# Patient Record
Sex: Female | Born: 1970 | Race: White | Hispanic: No | Smoking: Former smoker
Health system: Southern US, Community
[De-identification: ages and names within clinical notes are randomized; demographics above are authoritative.]

## PROBLEM LIST (undated history)

## (undated) DIAGNOSIS — R319 Hematuria, unspecified: Secondary | ICD-10-CM

## (undated) DIAGNOSIS — E559 Vitamin D deficiency, unspecified: Secondary | ICD-10-CM

## (undated) DIAGNOSIS — Z9884 Bariatric surgery status: Secondary | ICD-10-CM

## (undated) DIAGNOSIS — I10 Essential (primary) hypertension: Secondary | ICD-10-CM

## (undated) DIAGNOSIS — Z8639 Personal history of other endocrine, nutritional and metabolic disease: Secondary | ICD-10-CM

## (undated) DIAGNOSIS — N200 Calculus of kidney: Secondary | ICD-10-CM

## (undated) DIAGNOSIS — J302 Other seasonal allergic rhinitis: Secondary | ICD-10-CM

## (undated) DIAGNOSIS — R7303 Prediabetes: Secondary | ICD-10-CM

## (undated) DIAGNOSIS — R350 Frequency of micturition: Secondary | ICD-10-CM

## (undated) DIAGNOSIS — R3915 Urgency of urination: Secondary | ICD-10-CM

## (undated) DIAGNOSIS — F419 Anxiety disorder, unspecified: Secondary | ICD-10-CM

## (undated) DIAGNOSIS — R609 Edema, unspecified: Secondary | ICD-10-CM

## (undated) DIAGNOSIS — E039 Hypothyroidism, unspecified: Secondary | ICD-10-CM

## (undated) DIAGNOSIS — G4730 Sleep apnea, unspecified: Secondary | ICD-10-CM

## (undated) DIAGNOSIS — Z8669 Personal history of other diseases of the nervous system and sense organs: Secondary | ICD-10-CM

## (undated) DIAGNOSIS — J189 Pneumonia, unspecified organism: Secondary | ICD-10-CM

## (undated) DIAGNOSIS — D509 Iron deficiency anemia, unspecified: Secondary | ICD-10-CM

## (undated) DIAGNOSIS — F32A Depression, unspecified: Secondary | ICD-10-CM

## (undated) DIAGNOSIS — I639 Cerebral infarction, unspecified: Secondary | ICD-10-CM

## (undated) DIAGNOSIS — E079 Disorder of thyroid, unspecified: Secondary | ICD-10-CM

## (undated) DIAGNOSIS — Z87442 Personal history of urinary calculi: Secondary | ICD-10-CM

## (undated) DIAGNOSIS — K219 Gastro-esophageal reflux disease without esophagitis: Secondary | ICD-10-CM

## (undated) DIAGNOSIS — J329 Chronic sinusitis, unspecified: Secondary | ICD-10-CM

## (undated) HISTORY — PX: MULTIPLE TOOTH EXTRACTIONS: SHX2053

## (undated) HISTORY — PX: PERCUTANEOUS NEPHROSTOLITHOTOMY: SHX2207

---

## 1997-06-22 ENCOUNTER — Ambulatory Visit (HOSPITAL_COMMUNITY): Admission: RE | Admit: 1997-06-22 | Discharge: 1997-06-22 | Payer: Self-pay | Admitting: Endocrinology

## 1997-09-27 ENCOUNTER — Other Ambulatory Visit: Admission: RE | Admit: 1997-09-27 | Discharge: 1997-09-27 | Payer: Self-pay | Admitting: Obstetrics and Gynecology

## 1997-11-20 ENCOUNTER — Other Ambulatory Visit: Admission: RE | Admit: 1997-11-20 | Discharge: 1997-11-20 | Payer: Self-pay | Admitting: Obstetrics and Gynecology

## 1999-02-21 ENCOUNTER — Other Ambulatory Visit: Admission: RE | Admit: 1999-02-21 | Discharge: 1999-02-21 | Payer: Self-pay | Admitting: *Deleted

## 2000-04-16 ENCOUNTER — Other Ambulatory Visit: Admission: RE | Admit: 2000-04-16 | Discharge: 2000-04-16 | Payer: Self-pay | Admitting: *Deleted

## 2001-06-21 ENCOUNTER — Other Ambulatory Visit: Admission: RE | Admit: 2001-06-21 | Discharge: 2001-06-21 | Payer: Self-pay | Admitting: Obstetrics & Gynecology

## 2002-08-01 ENCOUNTER — Encounter: Payer: Self-pay | Admitting: Urology

## 2002-08-01 ENCOUNTER — Encounter: Admission: RE | Admit: 2002-08-01 | Discharge: 2002-08-01 | Payer: Self-pay | Admitting: Urology

## 2002-08-04 ENCOUNTER — Encounter: Payer: Self-pay | Admitting: Urology

## 2002-08-04 ENCOUNTER — Ambulatory Visit (HOSPITAL_BASED_OUTPATIENT_CLINIC_OR_DEPARTMENT_OTHER): Admission: RE | Admit: 2002-08-04 | Discharge: 2002-08-04 | Payer: Self-pay | Admitting: Urology

## 2003-05-01 ENCOUNTER — Emergency Department (HOSPITAL_COMMUNITY): Admission: EM | Admit: 2003-05-01 | Discharge: 2003-05-01 | Payer: Self-pay | Admitting: Emergency Medicine

## 2007-11-09 ENCOUNTER — Other Ambulatory Visit: Admission: RE | Admit: 2007-11-09 | Discharge: 2007-11-09 | Payer: Self-pay | Admitting: Obstetrics and Gynecology

## 2007-12-27 ENCOUNTER — Encounter: Admission: RE | Admit: 2007-12-27 | Discharge: 2007-12-27 | Payer: Self-pay | Admitting: Internal Medicine

## 2008-03-20 HISTORY — PX: LAPAROSCOPIC GASTRIC BYPASS: SUR771

## 2010-03-24 HISTORY — PX: OTHER SURGICAL HISTORY: SHX169

## 2011-09-23 ENCOUNTER — Other Ambulatory Visit: Payer: Self-pay | Admitting: Urology

## 2011-10-14 ENCOUNTER — Encounter (HOSPITAL_BASED_OUTPATIENT_CLINIC_OR_DEPARTMENT_OTHER): Payer: Self-pay | Admitting: *Deleted

## 2011-10-14 NOTE — Progress Notes (Signed)
NPO AFTER MN. ARRIVES AT 0715. NEEDS HG, URINE PREG., AND KUB.

## 2011-10-16 NOTE — H&P (Signed)
History of Present Illness        Left nephrolithiasis: A CT scan done on  08/28/11 revealed 5 stones in the left kidney located in the lower pole and midportion of the kidney with Hounsfield units ranging from 600-800. Her stones are visible but somewhat difficult to see on KUB. She underwent ESL by Dr. Aldean Ast in 5/04 for left renal calculi. In addition she underwent a percutaneous nephrolithotomy on the left side in [place] in 2/12 for a 2.5 cm left renal calculus.  Stone composition 2/12: CaOx.  Interval history: There was a question of whether her stones could be struvite however I note her most recent urine culture was negative and the one prior to that grew coag negative staph. I do not see where she has ever had metabolic workup for her stones. She she continues to have some intermittent left flank pain.   Past Medical History Problems  1. History of  Anxiety (Symptom) 300.00 2. History of  Depression 311 3. History of  Diabetes Mellitus 250.00 4. Former Smoker V15.82 5. History of  Gastric Ulcer 531.90 6. History of  Heartburn 787.1 7. History of  Hypercholesterolemia 272.0 8. History of  Hypertension 401.9 9. History of  Hypothyroidism 244.9 10. History of  Sleep Apnea 780.57  Surgical History Problems  1. History of  Cesarean Section 2. History of  Cystoscopy With Ureteroscopy 3. History of  High Gastric Bypass 4. History of  Percutaneous Lithotomy  Current Meds 1. Biotin 1000 MCG Oral Tablet; Therapy: (Recorded:06Jun2013) to 2. Fexofenadine HCl TABS; Therapy: (Recorded:06Jun2013) to 3. Fluconazole 100 MG Oral Tablet; TAKE 1 TABLET DAILY AS DIRECTED; Therapy: 06Jun2013 to  (Evaluate:07Jun2013); Last Rx:06Jun2013 4. Iron TABS; Therapy: (Recorded:17Jun2013) to 5. PriLOSEC 20 MG Oral Capsule Delayed Release; Therapy: (Recorded:06Jun2013) to 6. Vitamin D3 CAPS; Therapy: (Recorded:06Jun2013) to  Allergies Medication  1. No Known Drug Allergies  Family  History Problems  1. Maternal history of  Nephrolithiasis 2. Maternal history of  Urinary Frequency 3. Maternal history of  Urinary Tract Infection  Social History Problems  1. Caffeine Use 2. Marital History - Divorced V61.03 3. 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 History of  Alcohol Use  Review of Systems Constitutional, skin, eye, otolaryngeal, hematologic/lymphatic, cardiovascular, pulmonary, endocrine, musculoskeletal, gastrointestinal, neurological and psychiatric system(s) were reviewed and pertinent findings if present are noted.  Genitourinary: urinary frequency.    Vitals Vital Signs BMI Calculated: 36.37 BSA Calculated: 1.75 Height: 4 ft 10.5 in Weight: 178 lb  Blood Pressure: 125 / 84 Temperature: 98.7 F Heart Rate: 89  Physical Exam Constitutional: Well nourished and well developed . No acute distress.  ENT:. The ears and nose are normal in appearance.  Neck: The appearance of the neck is normal and no neck mass is present.  Pulmonary: No respiratory distress and normal respiratory rhythm and effort.  Cardiovascular: Heart rate and rhythm are normal . No peripheral edema.  Abdomen: The abdomen is soft and nontender. No masses are palpated. No CVA tenderness. No hernias are palpable. No hepatosplenomegaly noted.  Lymphatics: The femoral and inguinal nodes are not enlarged or tender.  Skin: Normal skin turgor, no visible rash and no visible skin lesions.  Neuro/Psych:. Mood and affect are appropriate.   . Genitourinary:  On physical examination she was not toxic.  She had little to no CVA tenderness or lateralization.   Assessment Assessed  1. Nephrolithiasis Of The Left Kidney 592.0   We discussed the options for treatment of her left renal calculi.  I do not believe percutaneous procedure is necessary. We discussed lithotripsy versus ureteroscopy and I've gone over the pluses and minuses of each of these. She's had previous ureteroscopy and previous  lithotripsy. In my opinion ureteroscopy with laser lithotripsy is most likely going to be most successful at removing the most stone burden. I've gone over the procedure with her in detail including its risks and complications and the need for a stent postoperatively. In addition I'm going to begin evaluation for stone risk.   Plan    1. Hypercalciuria profile today. 2. 24-hour urine for stone risk. 3. She is scheduled for left ureteroscopy and laser lithotripsy of her left renal calculi.

## 2011-10-17 ENCOUNTER — Encounter (HOSPITAL_BASED_OUTPATIENT_CLINIC_OR_DEPARTMENT_OTHER)
Admission: RE | Disposition: A | Discharge status: Home / Self Care | Payer: Self-pay | Source: Ambulatory Visit | Attending: Urology

## 2011-10-17 ENCOUNTER — Encounter (HOSPITAL_BASED_OUTPATIENT_CLINIC_OR_DEPARTMENT_OTHER): Payer: Self-pay | Admitting: *Deleted

## 2011-10-17 ENCOUNTER — Encounter (HOSPITAL_BASED_OUTPATIENT_CLINIC_OR_DEPARTMENT_OTHER): Payer: Self-pay | Admitting: Anesthesiology

## 2011-10-17 ENCOUNTER — Ambulatory Visit (HOSPITAL_COMMUNITY): Payer: 59

## 2011-10-17 ENCOUNTER — Ambulatory Visit (HOSPITAL_BASED_OUTPATIENT_CLINIC_OR_DEPARTMENT_OTHER)
Admission: RE | Admit: 2011-10-17 | Discharge: 2011-10-17 | Disposition: A | Discharge status: Home / Self Care | Payer: 59 | Source: Ambulatory Visit | Attending: Urology | Admitting: Urology

## 2011-10-17 ENCOUNTER — Ambulatory Visit (HOSPITAL_BASED_OUTPATIENT_CLINIC_OR_DEPARTMENT_OTHER): Payer: 59 | Admitting: Anesthesiology

## 2011-10-17 DIAGNOSIS — E039 Hypothyroidism, unspecified: Secondary | ICD-10-CM | POA: Insufficient documentation

## 2011-10-17 DIAGNOSIS — Z79899 Other long term (current) drug therapy: Secondary | ICD-10-CM | POA: Insufficient documentation

## 2011-10-17 DIAGNOSIS — N200 Calculus of kidney: Secondary | ICD-10-CM | POA: Diagnosis present

## 2011-10-17 DIAGNOSIS — E119 Type 2 diabetes mellitus without complications: Secondary | ICD-10-CM | POA: Insufficient documentation

## 2011-10-17 DIAGNOSIS — I10 Essential (primary) hypertension: Secondary | ICD-10-CM | POA: Insufficient documentation

## 2011-10-17 DIAGNOSIS — G4730 Sleep apnea, unspecified: Secondary | ICD-10-CM | POA: Insufficient documentation

## 2011-10-17 DIAGNOSIS — E7800 Pure hypercholesterolemia, unspecified: Secondary | ICD-10-CM | POA: Insufficient documentation

## 2011-10-17 HISTORY — DX: Personal history of other diseases of the nervous system and sense organs: Z86.69

## 2011-10-17 HISTORY — DX: Other seasonal allergic rhinitis: J30.2

## 2011-10-17 HISTORY — DX: Hematuria, unspecified: R31.9

## 2011-10-17 HISTORY — DX: Vitamin D deficiency, unspecified: E55.9

## 2011-10-17 HISTORY — DX: Frequency of micturition: R35.0

## 2011-10-17 HISTORY — DX: Urgency of urination: R39.15

## 2011-10-17 HISTORY — DX: Iron deficiency anemia, unspecified: D50.9

## 2011-10-17 HISTORY — DX: Bariatric surgery status: Z98.84

## 2011-10-17 HISTORY — DX: Calculus of kidney: N20.0

## 2011-10-17 HISTORY — DX: Personal history of other endocrine, nutritional and metabolic disease: Z86.39

## 2011-10-17 HISTORY — DX: Personal history of urinary calculi: Z87.442

## 2011-10-17 HISTORY — PX: CYSTOSCOPY W/ URETERAL STENT PLACEMENT: SHX1429

## 2011-10-17 LAB — GLUCOSE, CAPILLARY: Glucose-Capillary: 103 mg/dL — ABNORMAL HIGH (ref 70–99)

## 2011-10-17 LAB — POCT HEMOGLOBIN-HEMACUE: Hemoglobin: 13.8 g/dL (ref 12.0–15.0)

## 2011-10-17 LAB — POCT PREGNANCY, URINE: Preg Test, Ur: NEGATIVE

## 2011-10-17 SURGERY — HOLMIUM LASER APPLICATION
Anesthesia: General | Site: Ureter | Laterality: Left

## 2011-10-17 MED ORDER — LIDOCAINE HCL (CARDIAC) 20 MG/ML IV SOLN
INTRAVENOUS | Status: DC | PRN
  Administered 2011-10-17: 80 mg via INTRAVENOUS

## 2011-10-17 MED ORDER — BELLADONNA ALKALOIDS-OPIUM 16.2-60 MG RE SUPP
RECTAL | Status: DC | PRN
  Administered 2011-10-17: 1 via RECTAL

## 2011-10-17 MED ORDER — PHENAZOPYRIDINE HCL 200 MG PO TABS: 200.0000 mg | ORAL_TABLET | Freq: Three times a day (TID) | ORAL | Status: AC | PRN

## 2011-10-17 MED ORDER — EPHEDRINE SULFATE 50 MG/ML IJ SOLN
INTRAMUSCULAR | Status: DC | PRN
  Administered 2011-10-17: 10 mg via INTRAVENOUS

## 2011-10-17 MED ORDER — HYDROCODONE-ACETAMINOPHEN 10-325 MG PO TABS: 1.0000 | ORAL_TABLET | ORAL | Status: AC | PRN

## 2011-10-17 MED ORDER — FENTANYL CITRATE 0.05 MG/ML IJ SOLN
INTRAMUSCULAR | Status: DC | PRN
  Administered 2011-10-17: 25 ug via INTRAVENOUS
  Administered 2011-10-17: 50 ug via INTRAVENOUS
  Administered 2011-10-17: 25 ug via INTRAVENOUS
  Administered 2011-10-17: 50 ug via INTRAVENOUS
  Administered 2011-10-17 (×3): 25 ug via INTRAVENOUS

## 2011-10-17 MED ORDER — IOHEXOL 350 MG/ML SOLN
INTRAVENOUS | Status: DC | PRN
  Administered 2011-10-17: 20 mL

## 2011-10-17 MED ORDER — DEXAMETHASONE SODIUM PHOSPHATE 4 MG/ML IJ SOLN
INTRAMUSCULAR | Status: DC | PRN
  Administered 2011-10-17: 10 mg via INTRAVENOUS

## 2011-10-17 MED ORDER — SODIUM CHLORIDE 0.9 % IR SOLN
Status: DC | PRN
  Administered 2011-10-17: 3000 mL

## 2011-10-17 MED ORDER — CIPROFLOXACIN IN D5W 200 MG/100ML IV SOLN
200.0000 mg | INTRAVENOUS | Status: AC
  Administered 2011-10-17: 200 mg via INTRAVENOUS

## 2011-10-17 MED ORDER — LACTATED RINGERS IV SOLN
INTRAVENOUS | Status: DC
  Administered 2011-10-17: 08:00:00 via INTRAVENOUS
  Administered 2011-10-17: 100 mL/h via INTRAVENOUS
  Administered 2011-10-17: 10:00:00 via INTRAVENOUS

## 2011-10-17 MED ORDER — LIDOCAINE HCL 2 % EX GEL
CUTANEOUS | Status: DC | PRN
  Administered 2011-10-17: 1

## 2011-10-17 MED ORDER — ONDANSETRON HCL 4 MG/2ML IJ SOLN
INTRAMUSCULAR | Status: DC | PRN
  Administered 2011-10-17: 4 mg via INTRAVENOUS

## 2011-10-17 MED ORDER — FENTANYL CITRATE 0.05 MG/ML IJ SOLN: 25.0000 ug | INTRAMUSCULAR | Status: DC | PRN

## 2011-10-17 MED ORDER — MIDAZOLAM HCL 5 MG/5ML IJ SOLN
INTRAMUSCULAR | Status: DC | PRN
  Administered 2011-10-17 (×2): 0.5 mg via INTRAVENOUS
  Administered 2011-10-17: 1 mg via INTRAVENOUS

## 2011-10-17 MED ORDER — PROPOFOL 10 MG/ML IV EMUL
INTRAVENOUS | Status: DC | PRN
  Administered 2011-10-17: 200 mg via INTRAVENOUS
  Administered 2011-10-17: 100 mg via INTRAVENOUS

## 2011-10-17 SURGICAL SUPPLY — 45 items
ADAPTER CATH URET PLST 4-6FR (CATHETERS) ×2 IMPLANT
ADPR CATH URET STRL DISP 4-6FR (CATHETERS) ×2
BAG DRAIN URO-CYSTO SKYTR STRL (DRAIN) ×3 IMPLANT
BAG DRN UROCATH (DRAIN) ×2
BASKET LASER NITINOL 1.9FR (BASKET) IMPLANT
BASKET STNLS GEMINI 4WIRE 3FR (BASKET) IMPLANT
BASKET ZERO TIP NITINOL 2.4FR (BASKET) ×4 IMPLANT
BRUSH URET BIOPSY 3F (UROLOGICAL SUPPLIES) IMPLANT
BSKT STON RTRVL 120 1.9FR (BASKET)
BSKT STON RTRVL GEM 120X11 3FR (BASKET)
BSKT STON RTRVL ZERO TP 2.4FR (BASKET) ×4
CANISTER SUCT LVC 12 LTR MEDI- (MISCELLANEOUS) ×2 IMPLANT
CATH INTERMIT  6FR 70CM (CATHETERS) IMPLANT
CATH URET 5FR 28IN CONE TIP (BALLOONS)
CATH URET 5FR 70CM CONE TIP (BALLOONS) IMPLANT
CLOTH BEACON ORANGE TIMEOUT ST (SAFETY) ×3 IMPLANT
DRAPE CAMERA CLOSED 9X96 (DRAPES) ×3 IMPLANT
ELECT REM PT RETURN 9FT ADLT (ELECTROSURGICAL)
ELECTRODE REM PT RTRN 9FT ADLT (ELECTROSURGICAL) IMPLANT
FIBER LASER TRAC TIP (UROLOGICAL SUPPLIES) ×2 IMPLANT
GLOVE BIO SURGEON STRL SZ8 (GLOVE) ×3 IMPLANT
GLOVE INDICATOR 6.5 STRL GRN (GLOVE) ×4 IMPLANT
GOWN PREVENTION PLUS LG XLONG (DISPOSABLE) ×3 IMPLANT
GOWN STRL REIN XL XLG (GOWN DISPOSABLE) ×3 IMPLANT
GOWN SURGICAL LARGE (GOWNS) ×2 IMPLANT
GUIDEWIRE 0.038 PTFE COATED (WIRE) IMPLANT
GUIDEWIRE ANG ZIPWIRE 038X150 (WIRE) IMPLANT
GUIDEWIRE STR DUAL SENSOR (WIRE) ×3 IMPLANT
IV NS IRRIG 3000ML ARTHROMATIC (IV SOLUTION) ×6 IMPLANT
KIT BALLIN UROMAX 15FX10 (LABEL) IMPLANT
KIT BALLN UROMAX 15FX4 (MISCELLANEOUS) IMPLANT
KIT BALLN UROMAX 26 75X4 (MISCELLANEOUS)
LASER FIBER DISP (UROLOGICAL SUPPLIES) IMPLANT
NS IRRIG 1000ML POUR BTL (IV SOLUTION) ×2 IMPLANT
PACK CYSTOSCOPY (CUSTOM PROCEDURE TRAY) ×3 IMPLANT
SET HIGH PRES BAL DIL (LABEL)
SHEATH ACCESS URETERAL 24CM (SHEATH) ×2 IMPLANT
SHEATH ACCESS URETERAL 38CM (SHEATH) IMPLANT
SHEATH ACCESS URETERAL 54CM (SHEATH) IMPLANT
SHEATH URET ACCESS 12FR/35CM (UROLOGICAL SUPPLIES) IMPLANT
SHEATH URET ACCESS 12FR/55CM (UROLOGICAL SUPPLIES) IMPLANT
STENT CONTOUR 6FRX24X.038 (STENTS) IMPLANT
STENT URET 6FRX24 CONTOUR (STENTS) ×2 IMPLANT
SYR CONTROL 10ML LL (SYRINGE) ×2 IMPLANT
WATER STERILE IRR 3000ML UROMA (IV SOLUTION) IMPLANT

## 2011-10-17 NOTE — Anesthesia Postprocedure Evaluation (Signed)
  Anesthesia Post-op Note  Patient: Tonya Cameron  Procedure(s) Performed: Procedure(s) (LRB): HOLMIUM LASER APPLICATION (Left) CYSTOSCOPY WITH RETROGRADE PYELOGRAM/URETERAL STENT PLACEMENT (Left)  Patient Location: PACU  Anesthesia Type: General  Level of Consciousness: oriented and sedated  Airway and Oxygen Therapy: Patient Spontanous Breathing  Post-op Pain: mild  Post-op Assessment: Post-op Vital signs reviewed, Patient's Cardiovascular Status Stable, Respiratory Function Stable and Patent Airway  Post-op Vital Signs: stable  Complications: No apparent anesthesia complications

## 2011-10-17 NOTE — Anesthesia Procedure Notes (Signed)
Procedure Name: LMA Insertion Date/Time: 10/17/2011 9:16 AM Performed by: Fran Lowes Pre-anesthesia Checklist: Patient identified, Emergency Drugs available, Suction available and Patient being monitored Patient Re-evaluated:Patient Re-evaluated prior to inductionOxygen Delivery Method: Circle System Utilized Preoxygenation: Pre-oxygenation with 100% oxygen Intubation Type: IV induction Ventilation: Mask ventilation without difficulty LMA: LMA inserted LMA Size: 4.0 Number of attempts: 1 Airway Equipment and Method: bite block Placement Confirmation: positive ETCO2 Tube secured with: Tape Dental Injury: Teeth and Oropharynx as per pre-operative assessment  Comments: LMA inserted by Dr. Shireen Quan.

## 2011-10-17 NOTE — Op Note (Signed)
PATIENT:  Tonya Cameron  PRE-OPERATIVE DIAGNOSIS: Left renal calculi and  POST-OPERATIVE DIAGNOSIS: Same  PROCEDURE:  1. Cystoscopy with left retrograde pyelogram including interpretation 2. Left ureteroscopy with laser lithotripsy and stone extraction 3. Left double-J stent placement  SURGEON: Garnett Farm, MD  INDICATION: Patient is a 41 year old female with a history of renal calculi. She had a CT scan in 6/13 which revealed 5 stones in the left kidney located in the lower and midportion of the kidney with Hounsfield units ranging from 600-800. Her stones were visible but difficult to see on KUB. She had undergone a previous lithotripsy as well as a percutaneous nephrolithotomy in the past. She continued to have intermittent left flank pain and I felt although she had numerous stones in her kidney that a percutaneous procedure would be more procedure then necessary and therefore recommended ureteroscopy. We discussed the procedure its risks and complications as well as alternatives. She understands and has elected to proceed.  ANESTHESIA:  General  EBL:  Minimal  DRAINS: 6 French, 24 cm stent in the left ureter (with string)  SPECIMEN:  Stones  DISPOSITION OF SPECIMEN:  Given to patient  DESCRIPTION OF PROCEDURE: The patient was taken to the major OR and placed on the table. General anesthesia was administered and then the patient was moved to the dorsal lithotomy position. The genitalia was sterilely prepped and draped. An official timeout was performed.  Initially the 22 French cystoscope with 12 lens was passed under direct vision through the urethra which was noted be normal. The bladder was then entered and fully inspected. It was noted be free of any tumors stones or inflammatory lesions. Ureteral orifices were of normal configuration and position. A 6 French open-ended ureteral catheter was then passed through the cystoscope into the ureteral orifice in order to perform  a left retrograde pyelogram.  A retrograde pyelogram was performed by injecting full-strength contrast up the left ureter under direct fluoroscopic control. It revealed what appeared to be filling defects in the renal pelvis and middle pole calyx. There appeared to be some blunting of the calyces. I also noted that it was difficult to fill the lower pole calyces as it appeared there was some mild infundibular stenosis. This is likely secondary to her previous percutaneous nephrolithotomy. I then passed a 0.038 inch floppy-tipped guidewire through the open ended catheter and into the area of the renal pelvis and this was left in place. I passed an access sheath over the guidewire and up the left ureter without difficulty.. I then proceeded with ureteroscopy.  A 6 French flexible, digital ureteroscope was then passed through the access sheath and into the area of the renal pelvis. I first identified a stone in the renal pelvis. There was also one in the upper pole. These were fairly large and were treated with laser lithotripsy using a 200  holmium laser fiber. I was able to fragment both of the stones and then used the Nitinol basket to grasp and extract the stone fragments. 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 I was however able to pass the scope through this area and identified a stone. It was grasped with a Nitinol basket and drawn down the ureter but was too large to extract and therefore was left in the basket. I removed the outer sheath of the basket and the ureteroscope and then passed the ureteroscope back through the access sheath and used the laser to fragment the  stone while it was held in the basket. I then was able to remove the basket and inserted a new basket and remove the fragments. Finally I was able to flex the scope and access the lower pole with a great deal of difficulty. I was able to identify a stone that was fairly small measuring about  2-3 mm but despite multiple attempts I was unable to manipulate the scope and basket in a fashion that would allow me to engage the stone. I did find a stone in a parallel lower pole calyx and was able to grasp that with the Nitinol basket. It was drawn into the ureter and released from the basket. I then used the holmium laser to fragment the stone and extract the fragments. Finally the ureteroscope was again passed into the area the renal pelvis and each of the calyces was inspected and no further stones could be located in the upper or middle pole or the renal pelvis. There did appear to be a single stone and possibly a second smaller stone lower pole but could not be extracted. I therefore passed the guidewire through the ureteroscope into the area the renal pelvis and removed the ureteroscope.  I then backloaded the cystoscope over the guidewire and passed the stent over the guidewire into the area of the renal pelvis. As the guidewire was removed good curl was noted in the renal pelvis. The bladder was drained and the cystoscope was then removed. The patient tolerated the procedure well no intraoperative complications.  She will return to the office for stent removal. At that time one of her stones can be sent for composition analysis although clinically they were quite hard and clearly not struvite. She will then need to undergo a metabolic stone evaluation if one had not been previously done by her previous urologist. Those notes were requested.   PLAN OF CARE: Discharge to home after PACU  PATIENT DISPOSITION:  PACU - hemodynamically stable.

## 2011-10-17 NOTE — Transfer of Care (Signed)
Immediate Anesthesia Transfer of Care Note  Patient: Tonya Cameron  Procedure(s) Performed: Procedure(s) (LRB): HOLMIUM LASER APPLICATION (Left) CYSTOSCOPY WITH RETROGRADE PYELOGRAM/URETERAL STENT PLACEMENT (Left)  Patient Location: PACU  Anesthesia Type: General  Level of Consciousness: awake, oriented, sedated and patient cooperative  Airway & Oxygen Therapy: Patient Spontanous Breathing and Patient connected to face mask oxygen  Post-op Assessment: Report given to PACU RN and Post -op Vital signs reviewed and stable  Post vital signs: Reviewed and stable  Complications: No apparent anesthesia complications

## 2011-10-17 NOTE — Anesthesia Preprocedure Evaluation (Signed)
Anesthesia Evaluation  Patient identified by MRN, date of birth, ID band Patient awake    Reviewed: Allergy & Precautions, H&P , NPO status , Patient's Chart, lab work & pertinent test results, reviewed documented beta blocker date and time   Airway Mallampati: II TM Distance: >3 FB Neck ROM: Full    Dental  (+) Dental Advisory Given   Pulmonary neg pulmonary ROS,  OSA resolved with 150 lb wt loss breath sounds clear to auscultation        Cardiovascular negative cardio ROS  Rhythm:Regular Rate:Normal     Neuro/Psych negative neurological ROS  negative psych ROS   GI/Hepatic negative GI ROS, Neg liver ROS,   Endo/Other  Goiter, no replacement indicated S/p bariatric surgery  Renal/GU Kidney stone  negative genitourinary   Musculoskeletal negative musculoskeletal ROS (+)   Abdominal   Peds negative pediatric ROS (+)  Hematology negative hematology ROS (+)   Anesthesia Other Findings Upper front bonding  Reproductive/Obstetrics negative OB ROS                           Anesthesia Physical Anesthesia Plan  ASA: II  Anesthesia Plan: General   Post-op Pain Management:    Induction: Intravenous  Airway Management Planned: LMA  Additional Equipment:   Intra-op Plan:   Post-operative Plan: Extubation in OR  Informed Consent: I have reviewed the patients History and Physical, chart, labs and discussed the procedure including the risks, benefits and alternatives for the proposed anesthesia with the patient or authorized representative who has indicated his/her understanding and acceptance.   Dental advisory given  Plan Discussed with: CRNA and Surgeon  Anesthesia Plan Comments:         Anesthesia Quick Evaluation

## 2011-10-17 NOTE — Interval H&P Note (Signed)
History and Physical Interval Note:  10/17/2011 8:57 AM  Tonya Cameron  has presented today for surgery, with the diagnosis of Left Renal Calculi  The various methods of treatment have been discussed with the patient and family. After consideration of risks, benefits and other options for treatment, the patient has consented to  Procedure(s) (LRB): URETEROSCOPY (Left) HOLMIUM LASER APPLICATION (Left) as a surgical intervention .  The patient's history has been reviewed, patient examined, no change in status, stable for surgery.  I have reviewed the patient's chart and labs.  Questions were answered to the patient's satisfaction.     Garnett Farm

## 2011-10-23 ENCOUNTER — Encounter (HOSPITAL_BASED_OUTPATIENT_CLINIC_OR_DEPARTMENT_OTHER): Payer: Self-pay

## 2011-10-23 ENCOUNTER — Encounter (HOSPITAL_BASED_OUTPATIENT_CLINIC_OR_DEPARTMENT_OTHER): Payer: Self-pay | Admitting: Urology

## 2012-01-12 ENCOUNTER — Other Ambulatory Visit: Payer: Self-pay | Admitting: *Deleted

## 2016-06-30 ENCOUNTER — Emergency Department (HOSPITAL_COMMUNITY): Payer: 59

## 2016-06-30 ENCOUNTER — Emergency Department (HOSPITAL_COMMUNITY)
Admission: EM | Admit: 2016-06-30 | Discharge: 2016-07-01 | Disposition: A | Discharge status: Home / Self Care | Payer: 59 | Attending: Emergency Medicine | Admitting: Emergency Medicine

## 2016-06-30 ENCOUNTER — Encounter (HOSPITAL_COMMUNITY): Payer: Self-pay | Admitting: Emergency Medicine

## 2016-06-30 DIAGNOSIS — R202 Paresthesia of skin: Secondary | ICD-10-CM | POA: Diagnosis not present

## 2016-06-30 DIAGNOSIS — Z79899 Other long term (current) drug therapy: Secondary | ICD-10-CM | POA: Insufficient documentation

## 2016-06-30 DIAGNOSIS — J0101 Acute recurrent maxillary sinusitis: Secondary | ICD-10-CM | POA: Insufficient documentation

## 2016-06-30 DIAGNOSIS — E119 Type 2 diabetes mellitus without complications: Secondary | ICD-10-CM | POA: Insufficient documentation

## 2016-06-30 DIAGNOSIS — I10 Essential (primary) hypertension: Secondary | ICD-10-CM | POA: Insufficient documentation

## 2016-06-30 HISTORY — DX: Gastro-esophageal reflux disease without esophagitis: K21.9

## 2016-06-30 HISTORY — DX: Essential (primary) hypertension: I10

## 2016-06-30 LAB — I-STAT TROPONIN, ED: Troponin i, poc: 0 ng/mL (ref 0.00–0.08)

## 2016-06-30 LAB — COMPREHENSIVE METABOLIC PANEL
ALBUMIN: 3.8 g/dL (ref 3.5–5.0)
ALT: 14 U/L (ref 14–54)
ANION GAP: 5 (ref 5–15)
AST: 22 U/L (ref 15–41)
Alkaline Phosphatase: 65 U/L (ref 38–126)
BILIRUBIN TOTAL: 0.3 mg/dL (ref 0.3–1.2)
BUN: 14 mg/dL (ref 6–20)
CO2: 24 mmol/L (ref 22–32)
Calcium: 8.7 mg/dL — ABNORMAL LOW (ref 8.9–10.3)
Chloride: 108 mmol/L (ref 101–111)
Creatinine, Ser: 1.08 mg/dL — ABNORMAL HIGH (ref 0.44–1.00)
GFR calc Af Amer: >60 mL/min (ref >60)
GFR calc non Af Amer: >60 mL/min (ref >60)
GLUCOSE: 102 mg/dL — AB (ref 65–99)
Potassium: 3.9 mmol/L (ref 3.5–5.1)
SODIUM: 137 mmol/L (ref 135–145)
TOTAL PROTEIN: 7.2 g/dL (ref 6.5–8.1)

## 2016-06-30 LAB — CBC WITH DIFFERENTIAL/PLATELET
BASOS PCT: 0 %
Basophils Absolute: 0 10*3/uL (ref 0.0–0.1)
EOS ABS: 1.2 10*3/uL — AB (ref 0.0–0.7)
Eosinophils Relative: 13 %
HCT: 38.6 % (ref 36.0–46.0)
Hemoglobin: 12.4 g/dL (ref 12.0–15.0)
Lymphocytes Relative: 25 %
Lymphs Abs: 2.5 10*3/uL (ref 0.7–4.0)
MCH: 28.1 pg (ref 26.0–34.0)
MCHC: 32.1 g/dL (ref 30.0–36.0)
MCV: 87.5 fL (ref 78.0–100.0)
MONO ABS: 0.6 10*3/uL (ref 0.1–1.0)
MONOS PCT: 6 %
Neutro Abs: 5.5 10*3/uL (ref 1.7–7.7)
Neutrophils Relative %: 56 %
Platelets: 255 10*3/uL (ref 150–400)
RBC: 4.41 MIL/uL (ref 3.87–5.11)
RDW: 15.6 % — AB (ref 11.5–15.5)
WBC: 9.9 10*3/uL (ref 4.0–10.5)

## 2016-06-30 MED ORDER — LABETALOL HCL 5 MG/ML IV SOLN
10.0000 mg | Freq: Once | INTRAVENOUS | Status: AC
  Administered 2016-06-30: 5 mg via INTRAVENOUS
  Filled 2016-06-30: qty 4

## 2016-06-30 MED ORDER — LISINOPRIL 10 MG PO TABS: 10.0000 mg | ORAL_TABLET | Freq: Every day | ORAL | 0 refills | Status: DC

## 2016-06-30 NOTE — ED Triage Notes (Signed)
Pt states a week ago Friday she started having a tingling that would go up the back of her neck, head, face, and in her right arm and leg  Pt states the feeling would only last a few minutes then go away  Pt states it has happened several times since then but always went away  Pt states today it started this morning and has remained constant and also today she has had the feeling of numbness to her right side   No neuro deficits noted  Speech clear  Denies confusion

## 2016-06-30 NOTE — ED Notes (Signed)
Pt is being transported to CT.

## 2016-06-30 NOTE — ED Provider Notes (Signed)
[facility]-EMERGENCY DEPT Provider Note   CSN: [id number] Arrival date & time: 06/30/16  1903     History   Chief Complaint Chief Complaint  Patient presents with  . Numbness    HPI Tonya Cameron is a 46 y.o. female.  HPI Patient with history of diabetes and hypertension presents with right-sided numbness and tingling. She states this been going on for several days. Onset of numbness yesterday evening. States she had numbness to the right side of her face right upper and lower extremities. Numbness has since resolved. She now endorses tingling sensation in the same distribution. Denies any visual changes or speech changes. States she is no longer taking any medication for elevated blood pressure. She denies headache or neck pain. No nausea or vomiting. Past Medical History:  Diagnosis Date  . Calculus of left kidney   . Frequency of urination   . GERD (gastroesophageal reflux disease)   . Hematuria   . History of diabetes mellitus PT STATES NO PROBLEMS SINCE 2009 GASTRIC BYPASS  . History of kidney stones   . History of sleep apnea OSA W/ CPAP--  NO ISSUES SINCE 2009 GASTRIC BYPASS  . Hypertension   . Iron deficiency anemia   . S/P gastric bypass   . Seasonal allergies   . Urgency of urination   . Vitamin D deficiency     Patient Active Problem List   Diagnosis Date Noted  . Renal calculus, left 10/17/2011    Past Surgical History:  Procedure Laterality Date  . CESAREAN SECTION  04-20-1997  . CYSTOSCOPY W/ URETERAL STENT PLACEMENT  10/17/2011   Procedure: CYSTOSCOPY WITH RETROGRADE PYELOGRAM/URETERAL STENT PLACEMENT;  Surgeon: Garnett Farm, MD;  Location: [facility];  Service: Urology;  Laterality: Left;  . LAPAROSCOPIC GASTRIC BYPASS  03-20-2008  . MULTIPLE TOOTH EXTRACTIONS    . PERCUTANEOUS NEPHROSTOLITHOTOMY  JAN 2012 (WAKE MED)   LEFT SIDE STONE EXTRACTION  . RIGHT URETEROSCOPIC STONE EXTRACTION  JAN 2012    OB History    No data available         Home Medications    Prior to Admission medications   Medication Sig Start Date End Date Taking? Authorizing Provider  ALPRAZolam (XANAX) 0.25 MG tablet Take 0.25 mg by mouth daily.   Yes Historical Provider, MD  B Complex Vitamins (B COMPLEX PO) Place 1 tablet under the tongue daily.    Yes Historical Provider, MD  Biotin 1000 MCG tablet Take 1,000 mcg by mouth daily.   Yes Historical Provider, MD  Cholecalciferol (VITAMIN D3) 3000 units TABS Take 3,000 Units by mouth daily.   Yes Historical Provider, MD  citalopram (CELEXA) 40 MG tablet Take 40 mg by mouth at bedtime.   Yes Historical Provider, MD  diphenhydrAMINE (BENADRYL) 25 MG tablet Take 25 mg by mouth every 6 (six) hours as needed (for seasonal allergies.).    Yes Historical Provider, MD  ferrous sulfate 325 (65 FE) MG tablet Take 325 mg by mouth daily with breakfast.   Yes Historical Provider, MD  fexofenadine (ALLEGRA) 180 MG tablet Take 180 mg by mouth daily.    Yes Historical Provider, MD  ibuprofen (ADVIL,MOTRIN) 200 MG tablet Take 600-800 mg by mouth every 6 (six) hours as needed (for pain).    Yes Historical Provider, MD  Liniments [facility] ARTHRITIS PAIN RELIEF EX) Place 1 patch onto the skin daily as needed (for arthritis pain).   Yes Historical Provider, MD  Multiple Vitamins-Iron (MULTIVITAMIN/IRON PO) Take by mouth  daily.   Yes Historical Provider, MD  omeprazole (PRILOSEC) 20 MG capsule Take 20 mg by mouth at bedtime.   Yes Historical Provider, MD  pseudoephedrine (SUDAFED) 30 MG tablet Take 30 mg by mouth every 4 (four) hours as needed for congestion.   Yes Historical Provider, MD  sodium chloride (OCEAN) 0.65 % SOLN nasal spray Place 1 spray into both nostrils 4 (four) times daily as needed for congestion.   Yes Historical Provider, MD  triamcinolone (NASACORT ALLERGY 24HR) 55 MCG/ACT AERO nasal inhaler Place 2 sprays into the nose 2 (two) times daily.   Yes Historical Provider, MD  lisinopril (PRINIVIL,ZESTRIL)  10 MG tablet Take 1 tablet (10 mg total) by mouth daily. 06/30/16   Loren Racer, MD    Family History Family History  Problem Relation Age of Onset  . Hypertension Other   . Diabetes Other   . Lupus Other     Social History Social History  Substance Use Topics  . Smoking status: Never Smoker  . Smokeless tobacco: Never Used  . Alcohol use No     Allergies   Clindamycin/lincomycin and Zoloft [sertraline hcl]   Review of Systems Review of Systems  Constitutional: Negative for chills and fever.  Eyes: Negative for visual disturbance.  Respiratory: Negative for shortness of breath.   Cardiovascular: Negative for chest pain.  Gastrointestinal: Negative for abdominal pain, diarrhea, nausea and vomiting.  Musculoskeletal: Negative for back pain, myalgias and neck pain.  Skin: Negative for rash and wound.  Neurological: Positive for numbness. Negative for dizziness, syncope, weakness, light-headedness and headaches.  All other systems reviewed and are negative.    Physical Exam Updated Vital Signs BP (!) 166/92   Pulse 85   Temp 97.9 F (36.6 C) (Oral)   Resp (!) 26   Ht  (1.473 m)   Wt 200 lb (90.7 kg)   LMP 06/02/2016 (Approximate)   SpO2 96%   BMI 41.80 kg/m   Physical Exam  Constitutional: She is oriented to person, place, and time. She appears well-developed and well-nourished. No distress.  HENT:  Head: Normocephalic and atraumatic.  Mouth/Throat: Oropharynx is clear and moist. No oropharyngeal exudate.  Eyes: EOM are normal. Pupils are equal, round, and reactive to light.  Neck: Normal range of motion. Neck supple.  No meningismus. No posterior midline cervical tenderness to palpation.  Cardiovascular: Normal rate and regular rhythm.  Exam reveals no gallop and no friction rub.   No murmur heard. Pulmonary/Chest: Effort normal and breath sounds normal. No respiratory distress. She has no wheezes. She has no rales. She exhibits no tenderness.   Abdominal: Soft. Bowel sounds are normal. There is no tenderness. There is no rebound and no guarding.  Musculoskeletal: Normal range of motion. She exhibits no edema or tenderness.  No lower extremity swelling, asymmetry or tenderness.  Neurological: She is alert and oriented to person, place, and time.  Patient is alert and oriented x3 with clear, goal oriented speech. Patient has 5/5 motor in all extremities. Sensation is intact to light touch. Bilateral finger-to-nose is normal with no signs of dysmetria. Patient has a normal gait and walks without assistance.  Skin: Skin is warm and dry. Capillary refill takes less than 2 seconds. No rash noted. No erythema.  Psychiatric: She has a normal mood and affect. Her behavior is normal.  Nursing note and vitals reviewed.    ED Treatments / Results  Labs (all labs ordered are listed, but only abnormal results are displayed) Labs  Reviewed  CBC WITH DIFFERENTIAL/PLATELET - Abnormal; Notable for the following:       Result Value   RDW 15.6 (*)    Eosinophils Absolute 1.2 (*)    All other components within normal limits  COMPREHENSIVE METABOLIC PANEL - Abnormal; Notable for the following:    Glucose, Bld 102 (*)    Creatinine, Ser 1.08 (*)    Calcium 8.7 (*)    All other components within normal limits  I-STAT TROPOININ, ED    EKG  EKG Interpretation None       Radiology Ct Head Wo Contrast  Result Date: 06/30/2016 CLINICAL DATA:  Initial evaluation for 1 week history of right-sided numbness. EXAM: CT HEAD WITHOUT CONTRAST TECHNIQUE: Contiguous axial images were obtained from the base of the skull through the vertex without intravenous contrast. COMPARISON:  None. FINDINGS: Brain: Cerebral volume within normal limits for patient age. No evidence for acute intracranial hemorrhage. No findings to suggest acute large vessel territory infarct. No mass lesion, midline shift, or mass effect. Ventricles are normal in size without evidence for  hydrocephalus. No extra-axial fluid collection identified. Vascular: No hyperdense vessel identified.Scattered vascular calcifications noted within the carotid siphons. Skull: Scalp soft tissues demonstrate no acute abnormality.Calvarium intact. Sinuses/Orbits: Globes and orbital soft tissues are within normal limits. Scattered mucosal thickening with opacity within the maxillary sinuses bilaterally, left greater than right. Paranasal sinuses are otherwise clear. No mastoid effusion. IMPRESSION: 1. No acute intracranial process. 2. Mild bilateral maxillary sinusitis. Electronically Signed   By: Rise Mu M.D.   On: 06/30/2016 22:03    Procedures Procedures (including critical care time)  Medications Ordered in ED Medications  labetalol (NORMODYNE,TRANDATE) injection 10 mg (5 mg Intravenous Given 06/30/16 2137)     Initial Impression / Assessment and Plan / ED Course  I have reviewed the triage vital signs and the nursing notes.  Pertinent labs & imaging results that were available during my care of the patient were reviewed by me and considered in my medical decision making (see chart for details).    Patient has normal neurologic exam. Hypertension is improved after dose of labetalol. She's been on lisinopril in the past. We'll restart her lisinopril and advised follow-up with neurology and her primary physician. Return precautions given.   Final Clinical Impressions(s) / ED Diagnoses   Final diagnoses:  Paresthesia  Hypertension, unspecified type  Acute recurrent maxillary sinusitis    New Prescriptions New Prescriptions   LISINOPRIL (PRINIVIL,ZESTRIL) 10 MG TABLET    Take 1 tablet (10 mg total) by mouth daily.     Loren Racer, MD 06/30/16 [phone]

## 2017-09-16 ENCOUNTER — Ambulatory Visit (HOSPITAL_COMMUNITY)
Admission: EM | Admit: 2017-09-16 | Discharge: 2017-09-16 | Disposition: A | Discharge status: Home / Self Care | Payer: 59 | Attending: Family Medicine | Admitting: Family Medicine

## 2017-09-16 ENCOUNTER — Encounter (HOSPITAL_COMMUNITY): Payer: Self-pay

## 2017-09-16 DIAGNOSIS — H0100A Unspecified blepharitis right eye, upper and lower eyelids: Secondary | ICD-10-CM

## 2017-09-16 DIAGNOSIS — I10 Essential (primary) hypertension: Secondary | ICD-10-CM

## 2017-09-16 DIAGNOSIS — H1033 Unspecified acute conjunctivitis, bilateral: Secondary | ICD-10-CM

## 2017-09-16 MED ORDER — PREDNISONE 10 MG (21) PO TBPK: ORAL_TABLET | Freq: Every day | ORAL | 0 refills | Status: DC

## 2017-09-16 MED ORDER — LISINOPRIL 20 MG PO TABS
20.0000 mg | ORAL_TABLET | Freq: Every day | ORAL | 0 refills | Status: DC
Start: 2017-09-16 — End: 2020-05-19

## 2017-09-16 NOTE — ED Provider Notes (Signed)
[facility]   [id number] 09/16/17 Arrival Time: 1418  ASSESSMENT & PLAN:  1. Essential hypertension   2. Acute conjunctivitis of both eyes, unspecified acute conjunctivitis type   3. Blepharitis of both upper and lower eyelid of right eye, unspecified type    Meds ordered this encounter  Medications  . lisinopril (PRINIVIL,ZESTRIL) 20 MG tablet    Sig: Take 1 tablet (20 mg total) by mouth daily.    Dispense:  90 tablet    Refill:  0  . predniSONE (STERAPRED UNI-PAK 21 TAB) 10 MG (21) TBPK tablet    Sig: Take by mouth daily. Take as directed.    Dispense:  21 tablet    Refill:  0   Discussed the diagnosis and proper care of conjunctivitis.  Stressed household Presenter, broadcasting. Warm compress to eye(s). Local eye care discussed. Analgesics as needed.  OTC allergy medication daily.  Will have insurance soon and will schedule PCP f/u for HTN management. May recheck here in 1-2 weeks.  Reviewed expectations re: course of current medical issues. Questions answered. Outlined signs and symptoms indicating need for more acute intervention. Patient verbalized understanding. After Visit Summary given.   SUBJECTIVE:  Tonya Cameron is a 46 y.o. female who presents with complaint of persistent eye irritation and watery drainage; R>L. Onset gradual, over the past week. Now upper and lower eyelids of right eye very irritated. Injury: no. Visual changes: no. Contact lens use: no. Self treatment: none. Describes gritty/iching sensation. Questions allergic etiology.  Also requests refill of HTN medication. Has not taken in quite awhile. No symptoms.  ROS: As per HPI.  OBJECTIVE:  Vitals:   09/16/17 1522  BP: (!) 190/98  Pulse: 77  Resp: 18  Temp: 98.8 F (37.1 C)  TempSrc: Temporal  SpO2: 99%    General appearance: alert; no distress Eyes: eyelids/periorbital: blepharitis on the right; bilateral conjunctival injection 2+; bilateral watery drainage CV: RRR Lungs:  unlabored respirations Neck: supple Skin: warm and dry Psychological: alert and cooperative; normal mood and affect  Allergies  Allergen Reactions  . Clindamycin/Lincomycin Other (See Comments)    Oral rash(chicken pox in mouth) & thrush  . Zoloft [Sertraline Hcl] Swelling, Rash and Other (See Comments)    Full body rash, throat/eyes/tongue swelling    Past Medical History:  Diagnosis Date  . Calculus of left kidney   . Frequency of urination   . GERD (gastroesophageal reflux disease)   . Hematuria   . History of diabetes mellitus PT STATES NO PROBLEMS SINCE 2009 GASTRIC BYPASS  . History of kidney stones   . History of sleep apnea OSA W/ CPAP--  NO ISSUES SINCE 2009 GASTRIC BYPASS  . Hypertension   . Iron deficiency anemia   . S/P gastric bypass   . Seasonal allergies   . Urgency of urination   . Vitamin D deficiency    Social History   Socioeconomic History  . Marital status: Divorced    Spouse name: Not on file  . Number of children: Not on file  . Years of education: Not on file  . Highest education level: Not on file  Occupational History  . Not on file  Social Needs  . Financial resource strain: Not on file  . Food insecurity:    Worry: Not on file    Inability: Not on file  . Transportation needs:    Medical: Not on file    Non-medical: Not on file  Tobacco Use  . Smoking status: Never  Smoker  . Smokeless tobacco: Never Used  Substance and Sexual Activity  . Alcohol use: No  . Drug use: No  . Sexual activity: Not on file  Lifestyle  . Physical activity:    Days per week: Not on file    Minutes per session: Not on file  . Stress: Not on file  Relationships  . Social connections:    Talks on phone: Not on file    Gets together: Not on file    Attends religious service: Not on file    Active member of club or organization: Not on file    Attends meetings of clubs or organizations: Not on file    Relationship status: Not on file  . Intimate  partner violence:    Fear of current or ex partner: Not on file    Emotionally abused: Not on file    Physically abused: Not on file    Forced sexual activity: Not on file  Other Topics Concern  . Not on file  Social History Narrative  . Not on file   Family History  Problem Relation Age of Onset  . Hypertension Other   . Diabetes Other   . Lupus Other    Past Surgical History:  Procedure Laterality Date  . CESAREAN SECTION  04-20-1997  . CYSTOSCOPY W/ URETERAL STENT PLACEMENT  10/17/2011   Procedure: CYSTOSCOPY WITH RETROGRADE PYELOGRAM/URETERAL STENT PLACEMENT;  Surgeon: Garnett FarmMark C Ottelin, MD;  [facility];  Service: Urology;  Laterality: Left;  . LAPAROSCOPIC GASTRIC BYPASS  03-20-2008  . MULTIPLE TOOTH EXTRACTIONS    . PERCUTANEOUS NEPHROSTOLITHOTOMY  JAN 2012 (WAKE MED)   LEFT SIDE STONE EXTRACTION  . RIGHT URETEROSCOPIC STONE EXTRACTION  JAN 2012     Mardella LaymanHagler, Donia Yokum, MD 09/17/17 1002

## 2017-09-16 NOTE — ED Triage Notes (Signed)
Pt states the ongoing issue with her eye starting a little over a week ago and she has been monitoring the progression of the swelling and irritation.

## 2019-12-25 ENCOUNTER — Ambulatory Visit (HOSPITAL_COMMUNITY)
Admission: EM | Admit: 2019-12-25 | Discharge: 2019-12-25 | Disposition: A | Discharge status: Home / Self Care | Payer: PRIVATE HEALTH INSURANCE

## 2019-12-25 ENCOUNTER — Encounter (HOSPITAL_COMMUNITY): Payer: Self-pay | Admitting: Emergency Medicine

## 2019-12-25 ENCOUNTER — Other Ambulatory Visit: Payer: Self-pay

## 2019-12-25 DIAGNOSIS — S61451A Open bite of right hand, initial encounter: Secondary | ICD-10-CM

## 2019-12-25 DIAGNOSIS — W540XXA Bitten by dog, initial encounter: Secondary | ICD-10-CM | POA: Diagnosis not present

## 2019-12-25 HISTORY — DX: Disorder of thyroid, unspecified: E07.9

## 2019-12-25 MED ORDER — FLUCONAZOLE 150 MG PO TABS: 150.0000 mg | ORAL_TABLET | Freq: Once | ORAL | 1 refills | Status: AC

## 2019-12-25 MED ORDER — AMOXICILLIN-POT CLAVULANATE 875-125 MG PO TABS: 1.0000 | ORAL_TABLET | Freq: Two times a day (BID) | ORAL | 0 refills | Status: AC

## 2019-12-25 MED ORDER — TETANUS-DIPHTH-ACELL PERTUSSIS 5-2.5-18.5 LF-MCG/0.5 IM SUSP
0.5000 mL | Freq: Once | INTRAMUSCULAR | Status: AC
  Administered 2019-12-25: 0.5 mL via INTRAMUSCULAR

## 2019-12-25 MED ORDER — TETANUS-DIPHTH-ACELL PERTUSSIS 5-2.5-18.5 LF-MCG/0.5 IM SUSP
INTRAMUSCULAR | Status: AC
  Filled 2019-12-25: qty 0.5

## 2019-12-25 NOTE — ED Triage Notes (Signed)
Pt reports dog bite to right hand approx 2 hrs ago; states her hand "got in the way" when dog was going to grab her son's lunch.  2 lacerations noted - one to right posterior hand, one to right thumb, as well as superficial teeth marks.  States her dog is up to date on rabies vaccine.  Bleeding controlled.  Ice bag in place.  Moves all RUE fingers without any difficulty.

## 2019-12-25 NOTE — Discharge Instructions (Signed)
WOUND CARE Begin Augmentin twice daily for 1 week Please return in 7 days to have your stitches/staples removed or sooner if you have concerns.  Keep area clean and dry for 24 hours. Do not remove bandage, if applied.  After 24 hours, remove bandage and wash wound gently with mild soap and warm water. Reapply a new bandage after cleaning wound, if directed.  Continue daily cleansing with soap and water until stitches/staples are removed.  Do not apply any ointments or creams to the wound while stitches/staples are in place, as this may cause delayed healing.  Notify the office if you experience any of the following signs of infection: Swelling, redness, pus drainage, streaking, fever >101.0 F  Notify the office if you experience excessive bleeding that does not stop after 15-20 minutes of constant, firm pressure.

## 2019-12-26 NOTE — ED Provider Notes (Signed)
[facility]    CSN: [id number] Arrival date & time: 12/25/19  1523      History   Chief Complaint Chief Complaint  Patient presents with   Animal Bite    HPI Tonya Cameron is a 49 y.o. female history of DM t2, GERD, presenting today for evaluation of dog bite. She got in the way of her dog while protecting food and sustained bite and laceration to her right hand. Incident happened a few hours ago. Dog vaccines uptodate. Unsure of tetanus status. Denies difficulty bending fingers/hand. Denies concern for underlying fracture.   HPI  Past Medical History:  Diagnosis Date   Calculus of left kidney    Frequency of urination    GERD (gastroesophageal reflux disease)    Hematuria    History of diabetes mellitus PT STATES NO PROBLEMS SINCE 2009 GASTRIC BYPASS   History of kidney stones    History of sleep apnea OSA W/ CPAP--  NO ISSUES SINCE 2009 GASTRIC BYPASS   Hypertension    Iron deficiency anemia    S/P gastric bypass    Seasonal allergies    Thyroid disease    Urgency of urination    Vitamin D deficiency     Patient Active Problem List   Diagnosis Date Noted   Renal calculus, left 10/17/2011    Past Surgical History:  Procedure Laterality Date   CESAREAN SECTION  04-20-1997   CYSTOSCOPY W/ URETERAL STENT PLACEMENT  10/17/2011   Procedure: CYSTOSCOPY WITH RETROGRADE PYELOGRAM/URETERAL STENT PLACEMENT;  Surgeon: Garnett Farm, MD;  Location: [facility];  Service: Urology;  Laterality: Left;   LAPAROSCOPIC GASTRIC BYPASS  03-20-2008   MULTIPLE TOOTH EXTRACTIONS     PERCUTANEOUS NEPHROSTOLITHOTOMY  JAN 2012 (WAKE MED)   LEFT SIDE STONE EXTRACTION   RIGHT URETEROSCOPIC STONE EXTRACTION  JAN 2012    OB History   No obstetric history on file.      Home Medications    Prior to Admission medications   Medication Sig Start Date End Date Taking? Authorizing Provider  ALPRAZolam (XANAX) 0.25 MG tablet Take 0.25  mg by mouth daily.   Yes [provider]  B Complex Vitamins (B COMPLEX PO) Place 1 tablet under the tongue daily.    Yes [provider]  Cholecalciferol (VITAMIN D3) 3000 units TABS Take 3,000 Units by mouth daily.   Yes [provider]  citalopram (CELEXA) 40 MG tablet Take 40 mg by mouth at bedtime.   Yes [provider]  diphenhydrAMINE (BENADRYL) 25 MG tablet Take 25 mg by mouth every 6 (six) hours as needed (for seasonal allergies.).    Yes [provider]  ferrous sulfate 325 (65 FE) MG tablet Take 325 mg by mouth daily with breakfast.   Yes [provider]  fexofenadine (ALLEGRA) 180 MG tablet Take 180 mg by mouth daily.    Yes [provider]  ibuprofen (ADVIL,MOTRIN) 200 MG tablet Take 600-800 mg by mouth every 6 (six) hours as needed (for pain).    Yes [provider]  lisinopril (PRINIVIL,ZESTRIL) 20 MG tablet Take 1 tablet (20 mg total) by mouth daily. 09/16/17  Yes Hagler, Arlys John, MD  sodium chloride (OCEAN) 0.65 % SOLN nasal spray Place 1 spray into both nostrils 4 (four) times daily as needed for congestion.   Yes [provider]  triamcinolone (NASACORT ALLERGY 24HR) 55 MCG/ACT AERO nasal inhaler Place 2 sprays into the nose 2 (two) times daily.   Yes  [provider]  amLODipine (NORVASC) 10 MG tablet Take 10 mg by mouth daily. 12/02/19   [provider]  amoxicillin-clavulanate (AUGMENTIN) 875-125 MG tablet Take 1 tablet by mouth every 12 (twelve) hours for 7 days. 12/25/19 01/01/20  Whitley Strycharz C, PA-C  Biotin 1000 MCG tablet Take 1,000 mcg by mouth daily.    [provider]  EUTHYROX 100 MCG tablet Take 100 mcg by mouth every morning. 11/21/19   [provider]  Liniments (SALONPAS ARTHRITIS PAIN RELIEF EX) Place 1 patch onto the skin daily as needed (for arthritis pain).    [provider]  lisinopril (ZESTRIL) 40 MG tablet Take 40 mg by mouth daily.  12/13/19   [provider]  Multiple Vitamins-Iron (MULTIVITAMIN/IRON PO) Take by mouth daily.    [provider]  omeprazole (PRILOSEC) 20 MG capsule Take 20 mg by mouth at bedtime.    [provider]  predniSONE (STERAPRED UNI-PAK 21 TAB) 10 MG (21) TBPK tablet Take by mouth daily. Take as directed. 09/16/17   Mardella Layman, MD  pseudoephedrine (SUDAFED) 30 MG tablet Take 30 mg by mouth every 4 (four) hours as needed for congestion.    [provider]    Family History Family History  Problem Relation Age of Onset   Hypertension Other    Diabetes Other    Lupus Other     Social History Social History   Tobacco Use   Smoking status: Never Smoker   Smokeless tobacco: Never Used  Vaping Use   Vaping Use: Never used  Substance Use Topics   Alcohol use: No   Drug use: No     Allergies   Clindamycin/lincomycin and Zoloft [sertraline hcl]   Review of Systems Review of Systems  Constitutional: Negative for fatigue and fever.  Eyes: Negative for visual disturbance.  Respiratory: Negative for shortness of breath.   Cardiovascular: Negative for chest pain.  Gastrointestinal: Negative for abdominal pain, nausea and vomiting.  Musculoskeletal: Positive for arthralgias and joint swelling.  Skin: Positive for color change and wound. Negative for rash.  Neurological: Negative for dizziness, weakness, light-headedness and headaches.     Physical Exam Triage Vital Signs ED Triage Vitals  Enc Vitals Group     BP 12/25/19 1652 130/82     Pulse Rate 12/25/19 1652 90     Resp 12/25/19 1652 16     Temp 12/25/19 1652 98.7 F (37.1 C)     Temp Source 12/25/19 1652 Oral     SpO2 12/25/19 1652 97 %     Weight --      Height --      Head Circumference --      Peak Flow --      Pain Score 12/25/19 1646 2     Pain Loc --      Pain Edu? --      Excl. in GC? --    No data found.  Updated Vital Signs BP 130/82 (BP Location: Left Wrist)     Pulse 90    Temp 98.7 F (37.1 C) (Oral)    Resp 16    LMP 12/21/2019 (Exact Date)    SpO2 97%   Visual Acuity Right Eye Distance:   Left Eye Distance:   Bilateral Distance:    Right Eye Near:   Left Eye Near:    Bilateral Near:     Physical Exam Vitals and nursing note reviewed.  Constitutional:      Appearance: She is  well-developed.     Comments: No acute distress  HENT:     Head: Normocephalic and atraumatic.     Nose: Nose normal.  Eyes:     Conjunctiva/sclera: Conjunctivae normal.  Cardiovascular:     Rate and Rhythm: Normal rate.  Pulmonary:     Effort: Pulmonary effort is normal. No respiratory distress.  Abdominal:     General: There is no distension.  Musculoskeletal:        General: Normal range of motion.     Cervical back: Neck supple.     Comments: Right hand: superficial abrasions noted to dorsum of hand 1.25 cm elliptical laceration exposing subcutaneous fat noted at base of right thumb on dorsum of hand. No tendons or bone visualized.   Skin:    General: Skin is warm and dry.  Neurological:     Mental Status: She is alert and oriented to person, place, and time.      UC Treatments / Results  Labs (all labs ordered are listed, but only abnormal results are displayed) Labs Reviewed - No data to display  EKG   Radiology No results found.  Procedures Laceration Repair  Date/Time: 12/25/2019 6:17 PM Performed by: Ashtin Melichar, La ChuparosaHallie C, PA-C Authorized by: Hersey Maclellan, Junius CreamerHallie C, PA-C   Consent:    Consent obtained:  Verbal   Consent given by:  Patient   Risks discussed:  Pain, infection and need for additional repair Anesthesia (see MAR for exact dosages):    Anesthesia method:  Local infiltration   Local anesthetic:  Lidocaine 2% w/o epi Laceration details:    Location:  Hand   Hand location:  R hand, dorsum   Length (cm):  1.3 Repair type:    Repair type:  Simple Exploration:    Hemostasis achieved with:  Direct pressure   Wound extent:  no foreign bodies/material noted, no tendon damage noted and no underlying fracture noted     Contaminated: no   Treatment:    Area cleansed with:  Shur-Clens   Amount of cleaning:  Standard   Irrigation solution:  Sterile water   Irrigation method:  Syringe   Visualized foreign bodies/material removed: no   Skin repair:    Repair method:  Sutures   Suture size:  4-0   Suture material:  Prolene   Suture technique:  Simple interrupted   Number of sutures:  2 Approximation:    Approximation:  Loose Post-procedure details:    Dressing:  Non-adherent dressing   Patient tolerance of procedure:  Tolerated well, no immediate complications   (including critical care time)  Medications Ordered in UC Medications  Tdap (BOOSTRIX) injection 0.5 mL (0.5 mLs Intramuscular Given 12/25/19 1724)    Initial Impression / Assessment and Plan / UC Course  I have reviewed the triage vital signs and the nursing notes.  Pertinent labs & imaging results that were available during my care of the patient were reviewed by me and considered in my medical decision making (see chart for details).   Wound repaired despite being a dog bite, discussed risks with patient of increased risk of infeciton and patient wished to proceed. 2 loose sutures placed to be removed in 7 days. Augmentin twice daily for 1 week. Discussed wound care.   Discussed strict return precautions. Patient verbalized understanding and is agreeable with plan.  Final Clinical Impressions(s) / UC Diagnoses   Final diagnoses:  Dog bite of right hand, initial encounter     Discharge Instructions  WOUND CARE Begin Augmentin twice daily for 1 week Please return in 7 days to have your stitches/staples removed or sooner if you have concerns.  Keep area clean and dry for 24 hours. Do not remove bandage, if applied.  After 24 hours, remove bandage and wash wound gently with mild soap and warm water. Reapply a new bandage after cleaning  wound, if directed.  Continue daily cleansing with soap and water until stitches/staples are removed.  Do not apply any ointments or creams to the wound while stitches/staples are in place, as this may cause delayed healing.  Notify the office if you experience any of the following signs of infection: Swelling, redness, pus drainage, streaking, fever >101.0 F  Notify the office if you experience excessive bleeding that does not stop after 15-20 minutes of constant, firm pressure.    ED Prescriptions    Medication Sig Dispense Auth. Provider   amoxicillin-clavulanate (AUGMENTIN) 875-125 MG tablet Take 1 tablet by mouth every 12 (twelve) hours for 7 days. 14 tablet Issaac Shipper C, PA-C   fluconazole (DIFLUCAN) 150 MG tablet Take 1 tablet (150 mg total) by mouth once for 1 dose. 2 tablet Fransheska Willingham, [place] C, PA-C     PDMP not reviewed this encounter.   Lew Dawes, [place] 12/26/19 1819

## 2020-05-17 ENCOUNTER — Encounter (HOSPITAL_COMMUNITY)
Admission: EM | Disposition: A | Discharge status: Home / Self Care | Payer: Self-pay | Source: Home / Self Care | Attending: Internal Medicine

## 2020-05-17 ENCOUNTER — Emergency Department (HOSPITAL_COMMUNITY): Payer: BC Managed Care – PPO | Admitting: Certified Registered Nurse Anesthetist

## 2020-05-17 ENCOUNTER — Emergency Department (HOSPITAL_COMMUNITY): Payer: BC Managed Care – PPO

## 2020-05-17 ENCOUNTER — Encounter (HOSPITAL_COMMUNITY): Payer: Self-pay

## 2020-05-17 ENCOUNTER — Other Ambulatory Visit: Payer: Self-pay

## 2020-05-17 ENCOUNTER — Inpatient Hospital Stay (HOSPITAL_COMMUNITY)
Admission: EM | Admit: 2020-05-17 | Discharge: 2020-05-19 | DRG: 660 | Disposition: A | Discharge status: Home / Self Care | Payer: BC Managed Care – PPO | Attending: Internal Medicine | Admitting: Internal Medicine

## 2020-05-17 ENCOUNTER — Inpatient Hospital Stay (HOSPITAL_COMMUNITY): Payer: BC Managed Care – PPO

## 2020-05-17 DIAGNOSIS — N179 Acute kidney failure, unspecified: Secondary | ICD-10-CM | POA: Diagnosis not present

## 2020-05-17 DIAGNOSIS — Z888 Allergy status to other drugs, medicaments and biological substances status: Secondary | ICD-10-CM

## 2020-05-17 DIAGNOSIS — Z79890 Hormone replacement therapy (postmenopausal): Secondary | ICD-10-CM

## 2020-05-17 DIAGNOSIS — Z833 Family history of diabetes mellitus: Secondary | ICD-10-CM | POA: Diagnosis not present

## 2020-05-17 DIAGNOSIS — E119 Type 2 diabetes mellitus without complications: Secondary | ICD-10-CM | POA: Diagnosis present

## 2020-05-17 DIAGNOSIS — Z9884 Bariatric surgery status: Secondary | ICD-10-CM | POA: Diagnosis not present

## 2020-05-17 DIAGNOSIS — Z79899 Other long term (current) drug therapy: Secondary | ICD-10-CM

## 2020-05-17 DIAGNOSIS — E559 Vitamin D deficiency, unspecified: Secondary | ICD-10-CM | POA: Diagnosis present

## 2020-05-17 DIAGNOSIS — Z87442 Personal history of urinary calculi: Secondary | ICD-10-CM

## 2020-05-17 DIAGNOSIS — K219 Gastro-esophageal reflux disease without esophagitis: Secondary | ICD-10-CM | POA: Diagnosis present

## 2020-05-17 DIAGNOSIS — Z8249 Family history of ischemic heart disease and other diseases of the circulatory system: Secondary | ICD-10-CM | POA: Diagnosis not present

## 2020-05-17 DIAGNOSIS — N136 Pyonephrosis: Secondary | ICD-10-CM | POA: Diagnosis present

## 2020-05-17 DIAGNOSIS — N200 Calculus of kidney: Secondary | ICD-10-CM | POA: Diagnosis not present

## 2020-05-17 DIAGNOSIS — N201 Calculus of ureter: Secondary | ICD-10-CM

## 2020-05-17 DIAGNOSIS — N390 Urinary tract infection, site not specified: Secondary | ICD-10-CM | POA: Diagnosis not present

## 2020-05-17 DIAGNOSIS — I10 Essential (primary) hypertension: Secondary | ICD-10-CM | POA: Diagnosis not present

## 2020-05-17 DIAGNOSIS — Z881 Allergy status to other antibiotic agents status: Secondary | ICD-10-CM

## 2020-05-17 DIAGNOSIS — G4733 Obstructive sleep apnea (adult) (pediatric): Secondary | ICD-10-CM | POA: Diagnosis present

## 2020-05-17 DIAGNOSIS — Z8349 Family history of other endocrine, nutritional and metabolic diseases: Secondary | ICD-10-CM | POA: Diagnosis not present

## 2020-05-17 DIAGNOSIS — J302 Other seasonal allergic rhinitis: Secondary | ICD-10-CM | POA: Diagnosis present

## 2020-05-17 DIAGNOSIS — N202 Calculus of kidney with calculus of ureter: Secondary | ICD-10-CM | POA: Diagnosis present

## 2020-05-17 DIAGNOSIS — Z6841 Body Mass Index (BMI) 40.0 and over, adult: Secondary | ICD-10-CM | POA: Diagnosis not present

## 2020-05-17 DIAGNOSIS — Z20822 Contact with and (suspected) exposure to covid-19: Secondary | ICD-10-CM | POA: Diagnosis present

## 2020-05-17 DIAGNOSIS — D509 Iron deficiency anemia, unspecified: Secondary | ICD-10-CM | POA: Diagnosis present

## 2020-05-17 DIAGNOSIS — E039 Hypothyroidism, unspecified: Secondary | ICD-10-CM | POA: Diagnosis present

## 2020-05-17 HISTORY — DX: Hypothyroidism, unspecified: E03.9

## 2020-05-17 HISTORY — PX: CYSTOSCOPY W/ URETERAL STENT PLACEMENT: SHX1429

## 2020-05-17 LAB — COMPREHENSIVE METABOLIC PANEL
ALT: 19 U/L (ref 0–44)
AST: 21 U/L (ref 15–41)
Albumin: 2.5 g/dL — ABNORMAL LOW (ref 3.5–5.0)
Alkaline Phosphatase: 112 U/L (ref 38–126)
Anion gap: 12 (ref 5–15)
BUN: 76 mg/dL — ABNORMAL HIGH (ref 6–20)
CO2: 17 mmol/L — ABNORMAL LOW (ref 22–32)
Calcium: 7.9 mg/dL — ABNORMAL LOW (ref 8.9–10.3)
Chloride: 103 mmol/L (ref 98–111)
Creatinine, Ser: 3.91 mg/dL — ABNORMAL HIGH (ref 0.44–1.00)
GFR, Estimated: 13 mL/min — ABNORMAL LOW (ref >60)
Glucose, Bld: 105 mg/dL — ABNORMAL HIGH (ref 70–99)
Potassium: 3.9 mmol/L (ref 3.5–5.1)
Sodium: 132 mmol/L — ABNORMAL LOW (ref 135–145)
Total Bilirubin: 1.2 mg/dL (ref 0.3–1.2)
Total Protein: 6.6 g/dL (ref 6.5–8.1)

## 2020-05-17 LAB — CBC
HCT: 30 % — ABNORMAL LOW (ref 36.0–46.0)
HCT: 28.8 % — ABNORMAL LOW (ref 36.0–46.0)
Hemoglobin: 9.6 g/dL — ABNORMAL LOW (ref 12.0–15.0)
Hemoglobin: 9 g/dL — ABNORMAL LOW (ref 12.0–15.0)
MCH: 26.4 pg (ref 26.0–34.0)
MCH: 25.9 pg — ABNORMAL LOW (ref 26.0–34.0)
MCHC: 32 g/dL (ref 30.0–36.0)
MCHC: 31.3 g/dL (ref 30.0–36.0)
MCV: 82.4 fL (ref 80.0–100.0)
MCV: 83 fL (ref 80.0–100.0)
Platelets: 174 10*3/uL (ref 150–400)
Platelets: 172 10*3/uL (ref 150–400)
RBC: 3.64 MIL/uL — ABNORMAL LOW (ref 3.87–5.11)
RBC: 3.47 MIL/uL — ABNORMAL LOW (ref 3.87–5.11)
RDW: 17.5 % — ABNORMAL HIGH (ref 11.5–15.5)
RDW: 17.8 % — ABNORMAL HIGH (ref 11.5–15.5)
WBC: 7.7 10*3/uL (ref 4.0–10.5)
WBC: 7.1 10*3/uL (ref 4.0–10.5)
nRBC: 0 % (ref 0.0–0.2)
nRBC: 0 % (ref 0.0–0.2)

## 2020-05-17 LAB — URINALYSIS, ROUTINE W REFLEX MICROSCOPIC
Bilirubin Urine: NEGATIVE
Glucose, UA: NEGATIVE mg/dL
Ketones, ur: NEGATIVE mg/dL
Nitrite: POSITIVE — AB
Protein, ur: 100 mg/dL — AB
Specific Gravity, Urine: 1.014 (ref 1.005–1.030)
WBC, UA: >50 WBC/hpf — ABNORMAL HIGH (ref 0–5)
pH: 5 (ref 5.0–8.0)

## 2020-05-17 LAB — CREATININE, SERUM
Creatinine, Ser: 3.52 mg/dL — ABNORMAL HIGH (ref 0.44–1.00)
GFR, Estimated: 15 mL/min — ABNORMAL LOW (ref >60)

## 2020-05-17 LAB — I-STAT BETA HCG BLOOD, ED (MC, WL, AP ONLY): I-stat hCG, quantitative: 7.2 m[IU]/mL — ABNORMAL HIGH (ref <5)

## 2020-05-17 LAB — HCG, QUANTITATIVE, PREGNANCY: hCG, Beta Chain, Quant, S: 1 m[IU]/mL (ref <5)

## 2020-05-17 LAB — GLUCOSE, CAPILLARY: Glucose-Capillary: 109 mg/dL — ABNORMAL HIGH (ref 70–99)

## 2020-05-17 LAB — RESP PANEL BY RT-PCR (FLU A&B, COVID) ARPGX2
Influenza A by PCR: NEGATIVE
Influenza B by PCR: NEGATIVE
SARS Coronavirus 2 by RT PCR: NEGATIVE

## 2020-05-17 LAB — LIPASE, BLOOD: Lipase: 41 U/L (ref 11–51)

## 2020-05-17 SURGERY — CYSTOSCOPY, WITH RETROGRADE PYELOGRAM AND URETERAL STENT INSERTION
Anesthesia: General | Site: Ureter | Laterality: Right

## 2020-05-17 MED ORDER — STERILE WATER FOR IRRIGATION IR SOLN
Status: DC | PRN
  Administered 2020-05-17: 3000 mL

## 2020-05-17 MED ORDER — DEXAMETHASONE SODIUM PHOSPHATE 10 MG/ML IJ SOLN
INTRAMUSCULAR | Status: DC | PRN
  Administered 2020-05-17: 5 mg via INTRAVENOUS

## 2020-05-17 MED ORDER — SORBITOL 70 % SOLN
30.0000 mL | Status: DC | PRN
  Filled 2020-05-17: qty 30

## 2020-05-17 MED ORDER — MEPERIDINE HCL 50 MG/ML IJ SOLN: 6.2500 mg | INTRAMUSCULAR | Status: DC | PRN

## 2020-05-17 MED ORDER — HYDROMORPHONE HCL 1 MG/ML IJ SOLN: 0.2500 mg | INTRAMUSCULAR | Status: DC | PRN

## 2020-05-17 MED ORDER — SODIUM CHLORIDE 0.9 % IV SOLN
1.0000 g | Freq: Once | INTRAVENOUS | Status: AC
  Administered 2020-05-17: 1 g via INTRAVENOUS
  Filled 2020-05-17: qty 10

## 2020-05-17 MED ORDER — ACETAMINOPHEN 650 MG RE SUPP
650.0000 mg | Freq: Four times a day (QID) | RECTAL | Status: DC | PRN
  Filled 2020-05-17: qty 1

## 2020-05-17 MED ORDER — ONDANSETRON HCL 4 MG/2ML IJ SOLN: 4.0000 mg | Freq: Four times a day (QID) | INTRAMUSCULAR | Status: DC | PRN

## 2020-05-17 MED ORDER — ONDANSETRON HCL 4 MG/2ML IJ SOLN
INTRAMUSCULAR | Status: DC | PRN
  Administered 2020-05-17: 4 mg via INTRAVENOUS

## 2020-05-17 MED ORDER — SODIUM CHLORIDE 0.9 % IV SOLN
INTRAVENOUS | Status: AC
  Filled 2020-05-17: qty 20

## 2020-05-17 MED ORDER — HYDROMORPHONE HCL 1 MG/ML IJ SOLN
1.0000 mg | Freq: Once | INTRAMUSCULAR | Status: AC
  Administered 2020-05-17: 1 mg via INTRAVENOUS
  Filled 2020-05-17: qty 1

## 2020-05-17 MED ORDER — SODIUM CHLORIDE 0.9 % IV BOLUS
1000.0000 mL | Freq: Once | INTRAVENOUS | Status: AC
  Administered 2020-05-17: 1000 mL via INTRAVENOUS

## 2020-05-17 MED ORDER — LACTATED RINGERS IV SOLN: INTRAVENOUS | Status: DC | PRN

## 2020-05-17 MED ORDER — SODIUM CHLORIDE 0.9 % IV SOLN
1.0000 g | INTRAVENOUS | Status: DC
  Administered 2020-05-18: 1 g via INTRAVENOUS
  Filled 2020-05-17 (×2): qty 10
  Filled 2020-05-17: qty 1

## 2020-05-17 MED ORDER — STERILE WATER FOR INJECTION IV SOLN
INTRAVENOUS | Status: DC
  Filled 2020-05-17 (×3): qty 850
  Filled 2020-05-17: qty 150
  Filled 2020-05-17: qty 850

## 2020-05-17 MED ORDER — DOCUSATE SODIUM 283 MG RE ENEM
1.0000 | ENEMA | RECTAL | Status: DC | PRN
  Filled 2020-05-17: qty 1

## 2020-05-17 MED ORDER — FENTANYL CITRATE (PF) 100 MCG/2ML IJ SOLN
INTRAMUSCULAR | Status: DC | PRN
  Administered 2020-05-17: 50 ug via INTRAVENOUS

## 2020-05-17 MED ORDER — TRAMADOL HCL 50 MG PO TABS
50.0000 mg | ORAL_TABLET | Freq: Four times a day (QID) | ORAL | Status: DC | PRN
  Administered 2020-05-18: 50 mg via ORAL
  Filled 2020-05-17: qty 1

## 2020-05-17 MED ORDER — ZOLPIDEM TARTRATE 5 MG PO TABS
5.0000 mg | ORAL_TABLET | Freq: Every evening | ORAL | Status: DC | PRN
Start: 2020-05-17 — End: 2020-05-19
  Administered 2020-05-18: 5 mg via ORAL
  Filled 2020-05-17: qty 1

## 2020-05-17 MED ORDER — NEPRO/CARBSTEADY PO LIQD
237.0000 mL | Freq: Three times a day (TID) | ORAL | Status: DC | PRN
  Filled 2020-05-17: qty 237

## 2020-05-17 MED ORDER — CAMPHOR-MENTHOL 0.5-0.5 % EX LOTN
1.0000 "application " | TOPICAL_LOTION | Freq: Three times a day (TID) | CUTANEOUS | Status: DC | PRN
  Filled 2020-05-17: qty 222

## 2020-05-17 MED ORDER — FENTANYL CITRATE (PF) 100 MCG/2ML IJ SOLN
INTRAMUSCULAR | Status: AC
  Filled 2020-05-17: qty 2

## 2020-05-17 MED ORDER — HYDROXYZINE HCL 25 MG PO TABS: 25.0000 mg | ORAL_TABLET | Freq: Three times a day (TID) | ORAL | Status: DC | PRN

## 2020-05-17 MED ORDER — DEXTROSE 5 % IV SOLN
INTRAVENOUS | Status: DC | PRN
  Administered 2020-05-17: 1 g via INTRAVENOUS

## 2020-05-17 MED ORDER — HEPARIN SODIUM (PORCINE) 5000 UNIT/ML IJ SOLN
5000.0000 [IU] | Freq: Three times a day (TID) | INTRAMUSCULAR | Status: DC
  Administered 2020-05-18 – 2020-05-19 (×5): 5000 [IU] via SUBCUTANEOUS
  Filled 2020-05-17 (×5): qty 1

## 2020-05-17 MED ORDER — ACETAMINOPHEN 325 MG PO TABS: 650.0000 mg | ORAL_TABLET | Freq: Four times a day (QID) | ORAL | Status: DC | PRN

## 2020-05-17 MED ORDER — ONDANSETRON HCL 4 MG/2ML IJ SOLN: 4.0000 mg | Freq: Once | INTRAMUSCULAR | Status: DC | PRN

## 2020-05-17 MED ORDER — ONDANSETRON HCL 4 MG/2ML IJ SOLN
4.0000 mg | Freq: Once | INTRAMUSCULAR | Status: AC
  Administered 2020-05-17: 4 mg via INTRAVENOUS
  Filled 2020-05-17: qty 2

## 2020-05-17 MED ORDER — MIDAZOLAM HCL 2 MG/2ML IJ SOLN
INTRAMUSCULAR | Status: AC
  Filled 2020-05-17: qty 2

## 2020-05-17 MED ORDER — CALCIUM CARBONATE ANTACID 1250 MG/5ML PO SUSP
500.0000 mg | Freq: Four times a day (QID) | ORAL | Status: DC | PRN
  Filled 2020-05-17: qty 5

## 2020-05-17 MED ORDER — ACETAMINOPHEN 10 MG/ML IV SOLN: 1000.0000 mg | Freq: Once | INTRAVENOUS | Status: DC | PRN

## 2020-05-17 MED ORDER — ONDANSETRON HCL 4 MG PO TABS
4.0000 mg | ORAL_TABLET | Freq: Four times a day (QID) | ORAL | Status: DC | PRN
  Filled 2020-05-17: qty 1

## 2020-05-17 MED ORDER — IOHEXOL 300 MG/ML  SOLN
INTRAMUSCULAR | Status: DC | PRN
  Administered 2020-05-17: 50 mL

## 2020-05-17 MED ORDER — PROPOFOL 10 MG/ML IV BOLUS
INTRAVENOUS | Status: AC
  Filled 2020-05-17: qty 20

## 2020-05-17 SURGICAL SUPPLY — 14 items
BAG URO CATCHER STRL LF (MISCELLANEOUS) ×2 IMPLANT
BULB IRRIG PATHFIND (MISCELLANEOUS) IMPLANT
CATH INTERMIT  6FR 70CM (CATHETERS) ×2 IMPLANT
CLOTH BEACON ORANGE TIMEOUT ST (SAFETY) ×2 IMPLANT
GLOVE SURG ENC TEXT LTX SZ7.5 (GLOVE) ×2 IMPLANT
GOWN STRL REUS W/TWL LRG LVL3 (GOWN DISPOSABLE) ×4 IMPLANT
GUIDEWIRE STR DUAL SENSOR (WIRE) ×2 IMPLANT
KIT TURNOVER KIT A (KITS) ×2 IMPLANT
MANIFOLD NEPTUNE II (INSTRUMENTS) ×2 IMPLANT
PACK CYSTO (CUSTOM PROCEDURE TRAY) ×2 IMPLANT
STENT URET 6FRX22 CONTOUR (STENTS) ×1 IMPLANT
SYR 20ML LL LF (SYRINGE) ×2 IMPLANT
TUBING CONNECTING 10 (TUBING) ×2 IMPLANT
TUBING UROLOGY SET (TUBING) IMPLANT

## 2020-05-17 NOTE — Anesthesia Preprocedure Evaluation (Signed)
Anesthesia Evaluation  Patient identified by MRN, date of birth, ID band Patient awake    Reviewed: Allergy & Precautions, H&P , NPO status , Patient's Chart, lab work & pertinent test results  Airway Mallampati: II  TM Distance: >3 FB Neck ROM: Full    Dental  (+) Lower Dentures, Upper Dentures, Dental Advisory Given   Pulmonary sleep apnea and Continuous Positive Airway Pressure Ventilation ,  OSA resolved with 150 lb wt loss   Pulmonary exam normal        Cardiovascular hypertension, Pt. on medications negative cardio ROS Normal cardiovascular exam     Neuro/Psych negative neurological ROS  negative psych ROS   GI/Hepatic negative GI ROS, Neg liver ROS, GERD  Medicated,  Endo/Other  Morbid obesityGoiter, no replacement indicated S/p bariatric surgery  Renal/GU Renal diseaseKidney stone  negative genitourinary   Musculoskeletal negative musculoskeletal ROS (+)   Abdominal (+) + obese,   Peds negative pediatric ROS (+)  Hematology  (+) Blood dyscrasia, anemia ,   Anesthesia Other Findings Upper front bonding  Reproductive/Obstetrics negative OB ROS                             Anesthesia Physical  Anesthesia Plan  ASA: III  Anesthesia Plan: General   Post-op Pain Management:    Induction: Intravenous  PONV Risk Score and Plan: 4 or greater and Ondansetron, Dexamethasone and Midazolam  Airway Management Planned: LMA  Additional Equipment: None  Intra-op Plan:   Post-operative Plan: Extubation in OR  Informed Consent: I have reviewed the patients History and Physical, chart, labs and discussed the procedure including the risks, benefits and alternatives for the proposed anesthesia with the patient or authorized representative who has indicated his/her understanding and acceptance.     Dental advisory given  Plan Discussed with: CRNA  Anesthesia Plan Comments:          Anesthesia Quick Evaluation

## 2020-05-17 NOTE — ED Notes (Signed)
Pt is tearful and states she doesn't remember what provider told her what the plan was, visitor coming in, MD made aware

## 2020-05-17 NOTE — ED Notes (Signed)
RN aware of BP is low

## 2020-05-17 NOTE — Consult Note (Signed)
Urology Consult   Physician requesting consult: Dr Mikeal Hawthorne  Reason for consult: Right ureteral stone with acute kidney injury  History of Present Illness: Tonya Cameron is a 50 y.o. morbidly obese white female with prior history of stones which is required PCNL and ureteroscopy in the past in [facility].  Last procedure was around 2011 by her report.  He presents with almost 1 week history of some right-sided flank pain which got worse over the last 24 hours.  This is associated with nausea but no fever or gross hematuria.  She was seen in the emergency room today and serum creatinine noted to be elevated at 3.91 and she has a 9 mm proximal right ureteral calculus with obstruction.  Patient has numerous medical problems including hypertension morbid obesity, gastroesophageal reflux disease and diabetes.  Urology asked to evaluate for right ureteral calculus  She denies a history of voiding or storage urinary symptoms, hematuria, UTIs, STDs, urolithiasis, GU malignancy/trauma/surgery.  Past Medical History:  Diagnosis Date  . Calculus of left kidney   . Frequency of urination   . GERD (gastroesophageal reflux disease)   . Hematuria   . History of diabetes mellitus PT STATES NO PROBLEMS SINCE 2009 GASTRIC BYPASS  . History of kidney stones   . History of sleep apnea OSA W/ CPAP--  NO ISSUES SINCE 2009 GASTRIC BYPASS  . Hypertension   . Hypothyroid   . Iron deficiency anemia   . S/P gastric bypass   . Seasonal allergies   . Thyroid disease   . Urgency of urination   . Vitamin D deficiency     Past Surgical History:  Procedure Laterality Date  . CESAREAN SECTION  04-20-1997  . CYSTOSCOPY W/ URETERAL STENT PLACEMENT  10/17/2011   Procedure: CYSTOSCOPY WITH RETROGRADE PYELOGRAM/URETERAL STENT PLACEMENT;  Surgeon: Garnett Farm, MD;  Location: [facility];  Service: Urology;  Laterality: Left;  . LAPAROSCOPIC GASTRIC BYPASS  03-20-2008  . MULTIPLE TOOTH  EXTRACTIONS    . PERCUTANEOUS NEPHROSTOLITHOTOMY  JAN 2012 (WAKE MED)   LEFT SIDE STONE EXTRACTION  . RIGHT URETEROSCOPIC STONE EXTRACTION  JAN 2012     Current Hospital Medications:  Home meds:  No current facility-administered medications on file prior to encounter.   Current Outpatient Medications on File Prior to Encounter  Medication Sig Dispense Refill  . ALPRAZolam (XANAX) 0.5 MG tablet Take 0.25 mg by mouth daily as needed for anxiety.    Marland Kitchen amLODipine (NORVASC) 10 MG tablet Take 10 mg by mouth daily.    Marland Kitchen amphetamine-dextroamphetamine (ADDERALL) 30 MG tablet Take 30 mg by mouth 2 (two) times daily.    . ARIPiprazole (ABILIFY) 5 MG tablet Take 5 mg by mouth at bedtime.    . citalopram (CELEXA) 40 MG tablet Take 40 mg by mouth at bedtime.    . diphenhydrAMINE (BENADRYL) 25 MG tablet Take 25 mg by mouth every 6 (six) hours as needed for itching.    . EUTHYROX 100 MCG tablet Take 100 mcg by mouth every morning.    . ferrous sulfate 325 (65 FE) MG tablet Take 325 mg by mouth daily with breakfast.    . fexofenadine (ALLEGRA) 180 MG tablet Take 180 mg by mouth daily as needed.    Marland Kitchen ibuprofen (ADVIL,MOTRIN) 200 MG tablet Take 400 mg by mouth every 6 (six) hours as needed for mild pain (for pain).    . Liniments (SALONPAS ARTHRITIS PAIN RELIEF EX) Place 1 patch onto the skin  daily as needed (for arthritis pain).    Marland Kitchen. lisinopril (ZESTRIL) 40 MG tablet Take 40 mg by mouth daily.    . Multiple Vitamins-Iron (MULTIVITAMIN/IRON PO) Take 1 tablet by mouth daily.    Marland Kitchen. omeprazole (PRILOSEC) 20 MG capsule Take 20 mg by mouth at bedtime.    . sodium chloride (OCEAN) 0.65 % SOLN nasal spray Place 1 spray into both nostrils 4 (four) times daily as needed for congestion.    . triamcinolone (NASACORT) 55 MCG/ACT AERO nasal inhaler Place 2 sprays into the nose 2 (two) times daily.    Marland Kitchen. lisinopril (PRINIVIL,ZESTRIL) 20 MG tablet Take 1 tablet (20 mg total) by mouth daily. (Patient not taking: No sig  reported) 90 tablet 0  . predniSONE (STERAPRED UNI-PAK 21 TAB) 10 MG (21) TBPK tablet Take by mouth daily. Take as directed. (Patient not taking: No sig reported) 21 tablet 0     Scheduled Meds: Continuous Infusions: PRN Meds:.  Allergies:  Allergies  Allergen Reactions  . Clindamycin/Lincomycin Other (See Comments)    Oral rash(chicken pox in mouth) & thrush  . Zoloft [Sertraline Hcl] Swelling, Rash and Other (See Comments)    Full body rash, throat/eyes/tongue swelling    Family History  Problem Relation Age of Onset  . Hypertension Other   . Diabetes Other   . Lupus Other   . Thyroid disease Mother   . Heart failure Father     Social History:  reports that she has never smoked. She has never used smokeless tobacco. She reports that she does not drink alcohol and does not use drugs.  ROS: A complete review of systems was performed.  All systems are negative except for pertinent findings as noted.  Physical Exam:  Vital signs in last 24 hours: Temp:  [98.5 F (36.9 C)] 98.5 F (36.9 C) (02/24 1311) Pulse Rate:  [86-94] 94 (02/24 1935) Resp:  [15-20] 20 (02/24 1935) BP: (85-117)/(53-83) 117/65 (02/24 1935) SpO2:  [90 %-99 %] 97 % (02/24 1735) Weight:  [97.5 kg] 97.5 kg (02/24 1312) Constitutional:  Alert and oriented, No acute distress, patient in discomfort, morbidly obese Cardiovascular: Regular rate and rhythm, No JVD Respiratory: Normal respiratory effort, Lungs clear bilaterally GI: Abdomen is soft, nontender, nondistended,  GU: Right CVA tenderness Lymphatic: No lymphadenopathy Neurologic: Grossly intact, no focal deficits Psychiatric: Normal mood and affect  Laboratory Data:  Recent Labs    05/17/20 1317  WBC 7.7  HGB 9.6*  HCT 30.0*  PLT 174    Recent Labs    05/17/20 1317  NA 132*  K 3.9  CL 103  GLUCOSE 105*  BUN 76*  CALCIUM 7.9*  CREATININE 3.91*     Results for orders placed or performed during the hospital encounter of 05/17/20  (from the past 24 hour(s))  Lipase, blood     Status: None   Collection Time: 05/17/20  1:17 PM  Result Value Ref Range   Lipase 41 11 - 51 U/L  Comprehensive metabolic panel     Status: Abnormal   Collection Time: 05/17/20  1:17 PM  Result Value Ref Range   Sodium 132 (L) 135 - 145 mmol/L   Potassium 3.9 3.5 - 5.1 mmol/L   Chloride 103 98 - 111 mmol/L   CO2 17 (L) 22 - 32 mmol/L   Glucose, Bld 105 (H) 70 - 99 mg/dL   BUN 76 (H) 6 - 20 mg/dL   Creatinine, Ser 1.613.91 (H) 0.44 - 1.00 mg/dL   Calcium 7.9 (  L) 8.9 - 10.3 mg/dL   Total Protein 6.6 6.5 - 8.1 g/dL   Albumin 2.5 (L) 3.5 - 5.0 g/dL   AST 21 15 - 41 U/L   ALT 19 0 - 44 U/L   Alkaline Phosphatase 112 38 - 126 U/L   Total Bilirubin 1.2 0.3 - 1.2 mg/dL   GFR, Estimated 13 (L) >60 mL/min   Anion gap 12 5 - 15  CBC     Status: Abnormal   Collection Time: 05/17/20  1:17 PM  Result Value Ref Range   WBC 7.7 4.0 - 10.5 K/uL   RBC 3.64 (L) 3.87 - 5.11 MIL/uL   Hemoglobin 9.6 (L) 12.0 - 15.0 g/dL   HCT 64.3 (L) 32.9 - 51.8 %   MCV 82.4 80.0 - 100.0 fL   MCH 26.4 26.0 - 34.0 pg   MCHC 32.0 30.0 - 36.0 g/dL   RDW 84.1 (H) 66.0 - 63.0 %   Platelets 174 150 - 400 K/uL   nRBC 0.0 0.0 - 0.2 %  I-Stat beta hCG blood, ED     Status: Abnormal   Collection Time: 05/17/20  1:39 PM  Result Value Ref Range   I-stat hCG, quantitative 7.2 (H) <5 mIU/mL   Comment 3          Urinalysis, Routine w reflex microscopic Urine, Clean Catch     Status: Abnormal   Collection Time: 05/17/20  2:17 PM  Result Value Ref Range   Color, Urine YELLOW YELLOW   APPearance CLOUDY (A) CLEAR   Specific Gravity, Urine 1.014 1.005 - 1.030   pH 5.0 5.0 - 8.0   Glucose, UA NEGATIVE NEGATIVE mg/dL   Hgb urine dipstick LARGE (A) NEGATIVE   Bilirubin Urine NEGATIVE NEGATIVE   Ketones, ur NEGATIVE NEGATIVE mg/dL   Protein, ur 160 (A) NEGATIVE mg/dL   Nitrite POSITIVE (A) NEGATIVE   Leukocytes,Ua LARGE (A) NEGATIVE   RBC / HPF 6-10 0 - 5 RBC/hpf   WBC, UA >50  (H) 0 - 5 WBC/hpf   Bacteria, UA MANY (A) NONE SEEN   Squamous Epithelial / LPF 0-5 0 - 5   WBC Clumps PRESENT    Non Squamous Epithelial 0-5 (A) NONE SEEN  hCG, quantitative, pregnancy     Status: None   Collection Time: 05/17/20  3:30 PM  Result Value Ref Range   hCG, Beta Chain, Quant, S 1 <5 mIU/mL   No results found for this or any previous visit (from the past 240 hour(s)).  Renal Function: Recent Labs    05/17/20 1317  CREATININE 3.91*   Estimated Creatinine Clearance: 17.4 mL/min (A) (by C-G formula based on SCr of 3.91 mg/dL (H)).  Radiologic Imaging: CT Renal Stone Study  Result Date: 05/17/2020 CLINICAL DATA:  Bilateral flank pain.  History kidney stones. EXAM: CT ABDOMEN AND PELVIS WITHOUT CONTRAST TECHNIQUE: Multidetector CT imaging of the abdomen and pelvis was performed following the standard protocol without IV contrast. COMPARISON:  08/28/2011 from [facility] FINDINGS: Lower chest: Clear lung bases. Normal heart size without pericardial or pleural effusion. Suspect lad or distal left main coronary artery calcification on 01/02. Dilated distal esophagus with fluid level within on 11/02. Hepatobiliary: Hepatomegaly at 23.4 cm craniocaudal. No gallstones. Gallbladder distension. Equivocal pericholecystic edema including on 38/2. No biliary duct dilatation. Pancreas: Normal, without mass or ductal dilatation. Spleen: Normal in size, without focal abnormality. Adrenals/Urinary Tract: Normal adrenal glands. Mild lleft renal cortical thinning. Mild right renal and perirenal edema with hydronephrosis  secondary to a stone of 9 mm at the ureteropelvic junction on 46/2. No bladder calculi. Stomach/Bowel: Surgical changes of presumably Roux-en-Y gastric bypass. Normal colon and terminal ileum. Otherwise normal small bowel. Vascular/Lymphatic: Aortic atherosclerosis. No abdominopelvic adenopathy. Reproductive: Normal uterus and adnexa. Other: No significant free fluid.  Musculoskeletal: No acute osseous abnormality. Lower lumbar spondylosis. IMPRESSION: 1. Mild right-sided urinary tract obstruction secondary to a 9 mm stone at the ureteropelvic junction. 2. Hepatomegaly. 3. Esophageal air fluid level suggests dysmotility or gastroesophageal reflux. 4. Gallbladder distension with equivocal pericholecystic edema. Correlate with right upper quadrant symptoms and consider ultrasound. 5. Suspicion of markedly advanced coronary artery atherosclerosis. Recommend assessment of coronary risk factors. 6. Aortic Atherosclerosis (ICD10-I70.0). Electronically Signed   By: Jeronimo Greaves M.D.   On: 05/17/2020 17:47    I independently reviewed the above imaging studies.  Impression/Recommendation: 1 right proximal ureteral calculus with obstruction 2.  Acute kidney injury Plan/recommendation: Recommend cystoscopy and insertion of right JJ stent followed by admission for monitoring of renal function.  Hospitalist service to admit patient following insertion right JJ stent.  Risks and benefits of stent placement were discussed in detail with the patient.  [name] 05/17/2020, 7:45 PM     CC:

## 2020-05-17 NOTE — ED Provider Notes (Signed)
[facility]-EMERGENCY DEPT Provider Note   CSN: [id number] Arrival date & time: 05/17/20  1301     History Chief Complaint  Patient presents with  . Flank Pain  . Emesis  . Diarrhea  . Dysuria    Tonya Cameron is a 50 y.o. female with a past medical history of kidney stones, GERD, hypertension presenting to the ED with a chief complaint of bilateral flank pain radiating to her abdomen.  Symptoms began about 5 days ago.  Reports associated nausea, vomiting and dysuria.  Denies any fever.  Has been having some loose stools.  Tried taking tramadol and Tylenol with only minimal improvement in her symptoms.  States that this does feel similar to her prior kidney stones.  She used a procedure for the kidney stones back in 2011 but has since been able to pass them without difficulty.  Denies any chest pain, shortness of breath, sick contacts with similar symptoms, injuries or falls, hematuria.  HPI     Past Medical History:  Diagnosis Date  . Calculus of left kidney   . Frequency of urination   . GERD (gastroesophageal reflux disease)   . Hematuria   . History of diabetes mellitus PT STATES NO PROBLEMS SINCE 2009 GASTRIC BYPASS  . History of kidney stones   . History of sleep apnea OSA W/ CPAP--  NO ISSUES SINCE 2009 GASTRIC BYPASS  . Hypertension   . Hypothyroid   . Iron deficiency anemia   . S/P gastric bypass   . Seasonal allergies   . Thyroid disease   . Urgency of urination   . Vitamin D deficiency     Patient Active Problem List   Diagnosis Date Noted  . Renal calculus, left 10/17/2011    Past Surgical History:  Procedure Laterality Date  . CESAREAN SECTION  04-20-1997  . CYSTOSCOPY W/ URETERAL STENT PLACEMENT  10/17/2011   Procedure: CYSTOSCOPY WITH RETROGRADE PYELOGRAM/URETERAL STENT PLACEMENT;  Surgeon: Garnett Farm, MD;  Location: [facility];  Service: Urology;  Laterality: Left;  . LAPAROSCOPIC GASTRIC BYPASS   03-20-2008  . MULTIPLE TOOTH EXTRACTIONS    . PERCUTANEOUS NEPHROSTOLITHOTOMY  JAN 2012 (WAKE MED)   LEFT SIDE STONE EXTRACTION  . RIGHT URETEROSCOPIC STONE EXTRACTION  JAN 2012     OB History   No obstetric history on file.     Family History  Problem Relation Age of Onset  . Hypertension Other   . Diabetes Other   . Lupus Other   . Thyroid disease Mother   . Heart failure Father     Social History   Tobacco Use  . Smoking status: Never Smoker  . Smokeless tobacco: Never Used  Vaping Use  . Vaping Use: Never used  Substance Use Topics  . Alcohol use: No  . Drug use: No    Home Medications Prior to Admission medications   Medication Sig Start Date End Date Taking? Authorizing Provider  ALPRAZolam (XANAX) 0.25 MG tablet Take 0.25 mg by mouth daily.    [provider]  ALPRAZolam Prudy Feeler) 0.5 MG tablet Take 0.5 mg by mouth 2 (two) times daily as needed for anxiety. 04/13/20   [provider]  amLODipine (NORVASC) 10 MG tablet Take 10 mg by mouth daily. 12/02/19   [provider]  amphetamine-dextroamphetamine (ADDERALL) 30 MG tablet Take 30 mg by mouth 2 (two) times daily. 04/18/20   [provider]  ARIPiprazole (ABILIFY) 5 MG tablet Take 5 mg by  mouth at bedtime. 04/12/20   [provider]  B Complex Vitamins (B COMPLEX PO) Place 1 tablet under the tongue daily.     [provider]  Biotin 1000 MCG tablet Take 1,000 mcg by mouth daily.    [provider]  Cholecalciferol (VITAMIN D3) 3000 units TABS Take 3,000 Units by mouth daily.    [provider]  citalopram (CELEXA) 40 MG tablet Take 40 mg by mouth at bedtime.    [provider]  diphenhydrAMINE (BENADRYL) 25 MG tablet Take 25 mg by mouth every 6 (six) hours as needed (for seasonal allergies.).     [provider]  EUTHYROX 100 MCG tablet Take 100 mcg by mouth every morning. 11/21/19   [provider]  ferrous sulfate 325  (65 FE) MG tablet Take 325 mg by mouth daily with breakfast.    [provider]  fexofenadine (ALLEGRA) 180 MG tablet Take 180 mg by mouth daily.     [provider]  ibuprofen (ADVIL,MOTRIN) 200 MG tablet Take 600-800 mg by mouth every 6 (six) hours as needed (for pain).     [provider]  Liniments [facility] ARTHRITIS PAIN RELIEF EX) Place 1 patch onto the skin daily as needed (for arthritis pain).    [provider]  lisinopril (PRINIVIL,ZESTRIL) 20 MG tablet Take 1 tablet (20 mg total) by mouth daily. 09/16/17   Mardella Layman, MD  lisinopril (ZESTRIL) 40 MG tablet Take 40 mg by mouth daily. 12/13/19   [provider]  Multiple Vitamins-Iron (MULTIVITAMIN/IRON PO) Take by mouth daily.    [provider]  omeprazole (PRILOSEC) 20 MG capsule Take 20 mg by mouth at bedtime.    [provider]  predniSONE (STERAPRED UNI-PAK 21 TAB) 10 MG (21) TBPK tablet Take by mouth daily. Take as directed. 09/16/17   Mardella Layman, MD  pseudoephedrine (SUDAFED) 30 MG tablet Take 30 mg by mouth every 4 (four) hours as needed for congestion.    [provider]  sodium chloride (OCEAN) 0.65 % SOLN nasal spray Place 1 spray into both nostrils 4 (four) times daily as needed for congestion.    [provider]  triamcinolone (NASACORT ALLERGY 24HR) 55 MCG/ACT AERO nasal inhaler Place 2 sprays into the nose 2 (two) times daily.    [provider]    Allergies    Clindamycin/lincomycin and Zoloft [sertraline hcl]  Review of Systems   Review of Systems  Constitutional: Negative for appetite change, chills and fever.  HENT: Negative for ear pain, rhinorrhea, sneezing and sore throat.   Eyes: Negative for photophobia and visual disturbance.  Respiratory: Negative for cough, chest tightness, shortness of breath and wheezing.   Cardiovascular: Negative for chest pain and palpitations.  Gastrointestinal: Positive for abdominal pain,  diarrhea, nausea and vomiting. Negative for blood in stool and constipation.  Genitourinary: Positive for dysuria and flank pain. Negative for hematuria and urgency.  Musculoskeletal: Negative for myalgias.  Skin: Negative for rash.  Neurological: Negative for dizziness, weakness and light-headedness.    Physical Exam Updated Vital Signs BP 100/72   Pulse 89   Temp 98.5 F (36.9 C) (Oral)   Resp 20   Ht 4\' 10"  (1.473 m)   Wt 97.5 kg   LMP 04/16/2020 (Approximate)   SpO2 97%   BMI 44.94 kg/m   Physical Exam Vitals and nursing note reviewed.  Constitutional:      General: She is not in acute distress.    Appearance: She  is well-developed and well-nourished. She is obese.  HENT:     Head: Normocephalic and atraumatic.     Nose: Nose normal.  Eyes:     General: No scleral icterus.       Left eye: No discharge.     Extraocular Movements: EOM normal.     Conjunctiva/sclera: Conjunctivae normal.  Cardiovascular:     Rate and Rhythm: Normal rate and regular rhythm.     Pulses: Intact distal pulses.     Heart sounds: Normal heart sounds. No murmur heard. No friction rub. No gallop.   Pulmonary:     Effort: Pulmonary effort is normal. No respiratory distress.     Breath sounds: Normal breath sounds.  Abdominal:     General: Bowel sounds are normal. There is no distension.     Palpations: Abdomen is soft.     Tenderness: There is abdominal tenderness. There is right CVA tenderness and left CVA tenderness. There is no guarding.     Comments: Right middle quadrant and left middle quadrant tenderness.  Musculoskeletal:        General: No edema. Normal range of motion.     Cervical back: Normal range of motion and neck supple.  Skin:    General: Skin is warm and dry.     Findings: No rash.  Neurological:     Mental Status: She is alert.     Motor: No abnormal muscle tone.     Coordination: Coordination normal.  Psychiatric:        Mood and Affect: Mood and affect normal.      ED Results / Procedures / Treatments   Labs (all labs ordered are listed, but only abnormal results are displayed) Labs Reviewed  COMPREHENSIVE METABOLIC PANEL - Abnormal; Notable for the following components:      Result Value   Sodium 132 (*)    CO2 17 (*)    Glucose, Bld 105 (*)    BUN 76 (*)    Creatinine, Ser 3.91 (*)    Calcium 7.9 (*)    Albumin 2.5 (*)    GFR, Estimated 13 (*)    All other components within normal limits  CBC - Abnormal; Notable for the following components:   RBC 3.64 (*)    Hemoglobin 9.6 (*)    HCT 30.0 (*)    RDW 17.5 (*)    All other components within normal limits  URINALYSIS, ROUTINE W REFLEX MICROSCOPIC - Abnormal; Notable for the following components:   APPearance CLOUDY (*)    Hgb urine dipstick LARGE (*)    Protein, ur 100 (*)    Nitrite POSITIVE (*)    Leukocytes,Ua LARGE (*)    WBC, UA >50 (*)    Bacteria, UA MANY (*)    Non Squamous Epithelial 0-5 (*)    All other components within normal limits  I-STAT BETA HCG BLOOD, ED ([facility], [facility], [facility] ONLY) - Abnormal; Notable for the following components:   I-stat hCG, quantitative 7.2 (*)    All other components within normal limits  URINE CULTURE  CULTURE, BLOOD (ROUTINE X 2)  CULTURE, BLOOD (ROUTINE X 2)  RESP PANEL BY RT-PCR (FLU A&B, COVID) ARPGX2  LIPASE, BLOOD  HCG, QUANTITATIVE, PREGNANCY    EKG None  Radiology CT Renal Stone Study  Result Date: 05/17/2020 CLINICAL DATA:  Bilateral flank pain.  History kidney stones. EXAM: CT ABDOMEN AND PELVIS WITHOUT CONTRAST TECHNIQUE: Multidetector CT imaging of the abdomen and pelvis was performed following the  standard protocol without IV contrast. COMPARISON:  08/28/2011 from [facility] FINDINGS: Lower chest: Clear lung bases. Normal heart size without pericardial or pleural effusion. Suspect lad or distal left main coronary artery calcification on 01/02. Dilated distal esophagus with fluid level within on 11/02. Hepatobiliary:  Hepatomegaly at 23.4 cm craniocaudal. No gallstones. Gallbladder distension. Equivocal pericholecystic edema including on 38/2. No biliary duct dilatation. Pancreas: Normal, without mass or ductal dilatation. Spleen: Normal in size, without focal abnormality. Adrenals/Urinary Tract: Normal adrenal glands. Mild lleft renal cortical thinning. Mild right renal and perirenal edema with hydronephrosis secondary to a stone of 9 mm at the ureteropelvic junction on 46/2. No bladder calculi. Stomach/Bowel: Surgical changes of presumably Roux-en-Y gastric bypass. Normal colon and terminal ileum. Otherwise normal small bowel. Vascular/Lymphatic: Aortic atherosclerosis. No abdominopelvic adenopathy. Reproductive: Normal uterus and adnexa. Other: No significant free fluid. Musculoskeletal: No acute osseous abnormality. Lower lumbar spondylosis. IMPRESSION: 1. Mild right-sided urinary tract obstruction secondary to a 9 mm stone at the ureteropelvic junction. 2. Hepatomegaly. 3. Esophageal air fluid level suggests dysmotility or gastroesophageal reflux. 4. Gallbladder distension with equivocal pericholecystic edema. Correlate with right upper quadrant symptoms and consider ultrasound. 5. Suspicion of markedly advanced coronary artery atherosclerosis. Recommend assessment of coronary risk factors. 6. Aortic Atherosclerosis (ICD10-I70.0). Electronically Signed   By: Jeronimo Greaves M.D.   On: 05/17/2020 17:47    Procedures .Critical Care Performed by: Dietrich Pates, PA-C Authorized by: Dietrich Pates, PA-C   Critical care provider statement:    Critical care time (minutes):  35   Critical care was necessary to treat or prevent imminent or life-threatening deterioration of the following conditions:  Renal failure, sepsis and dehydration   Critical care was time spent personally by me on the following activities:  Development of treatment plan with patient or surrogate, discussions with consultants, evaluation of patient's  response to treatment, examination of patient, obtaining history from patient or surrogate, ordering and performing treatments and interventions, ordering and review of laboratory studies, ordering and review of radiographic studies, re-evaluation of patient's condition and review of old charts   I assumed direction of critical care for this patient from another provider in my specialty: no       Medications Ordered in ED Medications  cefTRIAXone (ROCEPHIN) 1 g in sodium chloride 0.9 % 100 mL IVPB (has no administration in time range)  ondansetron (ZOFRAN) injection 4 mg (4 mg Intravenous Given 05/17/20 1357)  HYDROmorphone (DILAUDID) injection 1 mg (1 mg Intravenous Given 05/17/20 1357)  sodium chloride 0.9 % bolus 1,000 mL (1,000 mLs Intravenous New Bag/Given 05/17/20 1741)    ED Course  I have reviewed the triage vital signs and the nursing notes.  Pertinent labs & imaging results that were available during my care of the patient were reviewed by me and considered in my medical decision making (see chart for details).  Clinical Course as of 05/17/20 1757  Thu May 17, 2020  1425 Creatinine(!): 3.91 Most recent lab work done at [facility] shows a creatinine of 1 in 2018. [HK]  1426 Nitrite(!): POSITIVE [HK]  1442 Leukocytes,Ua(!): LARGE [HK]  1442 WBC, UA(!): >50 [HK]  1443 Bacteria, UA(!): MANY [HK]  1613 Patient states that she goes to a PCP for her annual physical.  Last time she saw her PCP was approximately 6 months ago.  She was never told that she had any type of kidney disease in the past. [HK]  1739 BP: 100/72 Improvement with fluids. [HK]    Clinical Course User  Index [HK] Dietrich PatesKhatri, Hina, PA-C   MDM Rules/Calculators/A&P                          50 year old female with past medical history of kidney stones, GERD, hypertension presenting to the ED with a chief complaint of bilateral flank pain radiating to abdomen.  Symptoms began 5 days ago.  Reports associated nausea, vomiting  and dysuria.  Reports some loose stools.  Minimal improvement noted with tramadol and Tylenol.  On exam abdomen is tender in the flank area.  No rebound or guarding.  She is afebrile here.  Lab work here significant for creatinine of 3.9.  We have no recent lab work done since 2018.  She had a creatinine of 1 at the time.  She tells me that she sees a PCP annually for her physical exam but was never told that she had any chronic kidney disease.  Urinalysis with evidence of infection with nitrites, leukocytes, pyuria and many bacteria.  This was sent for culture.  CBC with hemoglobin of 9.6 but again, no recent for comparison.  She denies any active bleeding.  Lipase is normal.  CT renal stone study shows 9 mm stone at the right UPJ with perirenal edema and hydronephrosis. Will consult urology due to size of stone as well as evidence of a UTI. Anticipate the patient will need admission to hospitalist service. I have started her on Rocephin.  Consulted urology, Dr. Benancio DeedsNewsome who states that patient will most likely need a stent placed tonight. He will see the patient counseled in the meantime asked that we admit to medicine service. He agrees with starting antibiotics and IV fluids. Patient states that she has been n.p.o. since last night.   Portions of this note were generated with Scientist, clinical (histocompatibility and immunogenetics)Dragon dictation software. Dictation errors may occur despite best attempts at proofreading.  Final Clinical Impression(s) / ED Diagnoses Final diagnoses:  AKI (acute kidney injury) (HCC)  Lower urinary tract infectious disease  Ureterolithiasis    Rx / DC Orders ED Discharge Orders    None       Dietrich PatesKhatri, Hina, PA-C 05/17/20 Mitzie [id number]    Plunkett, Whitney, MD 05/20/20 1325

## 2020-05-17 NOTE — ED Notes (Signed)
Pt is hypotenisve, provider made aware

## 2020-05-17 NOTE — ED Triage Notes (Signed)
Patient c/o bilateral flank pain that radiates to the abdomen x 5 days. Patient also c/o n/v/D and dysuria.. Patient reports a history of kidney stones.

## 2020-05-17 NOTE — ED Provider Notes (Signed)
Patient was received at shift change from [facility], she provided HPI, current work-up, likely disposition.  Please see her note for full detail.  In short patient with significant medical history of kidney stones, GERD, hypertension presents with chief complaint of bilateral flank pain rating to her abdomen, has been going on for the last 5 days, work-up shows that she has a 9 mm stone in her right ureter.  Previous provider consulted with urology spoke with Dr. Alvester Morin who will take patient to the OR for stenting and would like her admitted to hospitalist for observation.  Per previous provider will consult with hospitalist to have patient admitted. Physical Exam  BP 100/72   Pulse 89   Temp 98.5 F (36.9 C) (Oral)   Resp 20   Ht 4\' 10"  (1.473 m)   Wt 97.5 kg   LMP 04/16/2020 (Approximate)   SpO2 97%   BMI 44.94 kg/m   Physical Exam Vitals and nursing note reviewed.  Constitutional:      General: She is not in acute distress.    Appearance: Normal appearance. She is not ill-appearing or diaphoretic.  HENT:     Head: Normocephalic and atraumatic.     Nose: No congestion.  Eyes:     Conjunctiva/sclera: Conjunctivae normal.  Cardiovascular:     Rate and Rhythm: Normal rate and regular rhythm.  Pulmonary:     Effort: Pulmonary effort is normal.     Breath sounds: Normal breath sounds.  Musculoskeletal:     Cervical back: Neck supple.     Right lower leg: No edema.     Left lower leg: No edema.     Comments: Moving all 4 extremities at difficulty.  Skin:    General: Skin is warm and dry.  Neurological:     General: No focal deficit present.     Mental Status: She is alert.  Psychiatric:        Mood and Affect: Mood normal.     ED Course/Procedures   Clinical Course as of 05/17/20 1832  Thu May 17, 2020  1425 Creatinine(!): 3.91 Most recent lab work done at May 19, 2020 shows a creatinine of 1 in 2018. [HK]  1426 Nitrite(!): POSITIVE [HK]  1442 Leukocytes,Ua(!): LARGE  [HK]  1442 WBC, UA(!): >50 [HK]  1443 Bacteria, UA(!): MANY [HK]  1613 Patient states that she goes to a PCP for her annual physical.  Last time she saw her PCP was approximately 6 months ago.  She was never told that she had any type of kidney disease in the past. [HK]  1739 BP: 100/72 Improvement with fluids. [HK]    Clinical Course User Index [HK] 2019, PA-C    Procedures  MDM  Spoke with Dr. Dietrich Pates of the hospitalist team, he has accept the patient will come down evaluate her.       Mikeal Hawthorne, PA-C 05/17/20 1921    05/19/20, MD 05/21/20 [phone]

## 2020-05-17 NOTE — Transfer of Care (Signed)
Immediate Anesthesia Transfer of Care Note  Patient: Tonya Cameron  Procedure(s) Performed: CYSTOSCOPY WITH RETROGRADE PYELOGRAM/URETERAL STENT PLACEMENT (Right Ureter)  Patient Location: PACU  Anesthesia Type:General  Level of Consciousness: sedated, patient cooperative and responds to stimulation  Airway & Oxygen Therapy: Patient Spontanous Breathing and Patient connected to face mask oxygen  Post-op Assessment: Report given to RN and Post -op Vital signs reviewed and stable  Post vital signs: Reviewed and stable  Last Vitals:  Vitals Value Taken Time  BP 117/72 05/17/20 2150  Temp    Pulse    Resp 24 05/17/20 2152  SpO2    Vitals shown include unvalidated device data.  Last Pain:  Vitals:   05/17/20 1516  TempSrc:   PainSc: 7          Complications: No complications documented.

## 2020-05-17 NOTE — Anesthesia Procedure Notes (Signed)
Procedure Name: LMA Insertion Performed by: Emalynn Clewis J, CRNA Pre-anesthesia Checklist: Patient identified, Emergency Drugs available, Suction available, Patient being monitored and Timeout performed Patient Re-evaluated:Patient Re-evaluated prior to induction Oxygen Delivery Method: Circle system utilized Preoxygenation: Pre-oxygenation with 100% oxygen Induction Type: IV induction Ventilation: Mask ventilation without difficulty LMA: LMA inserted LMA Size: 4.0 Number of attempts: 1 Placement Confirmation: positive ETCO2 and breath sounds checked- equal and bilateral Tube secured with: Tape Dental Injury: Teeth and Oropharynx as per pre-operative assessment        

## 2020-05-17 NOTE — Op Note (Signed)
Preoperative diagnosis:  1.  Right ureteral calculus with obstruction Two.  Acute kidney injury  Postoperative diagnosis: 1.  Right ureteral calculus with obstruction Two.  Acute kidney injury  Procedure(s): 1.  Cystoscopy, right retrograde pyelogram with intraoperative interpretation, insertion right JJ stent  Surgeon: Dr. Karoline Caldwell  Anesthesia: General  Complications: None  EBL: Minimal  Specimens: None  Disposition of specimens: Not applicable  Intraoperative findings: Filling defect right proximal ureter just at the UPJ.  Stone appeared to migrate retrograde with passage of wire.  Gross purulence upon passage of open tip catheter.  Six [place] by 22 cm Percuflex plus soft contour stent placed.  Foley placed  Indication: 50 year old morbidly obese white female presents with severe right-sided flank pain acute kidney injury and signs of possible impending urosepsis.  Presents now for cystoscopy and insertion of right JJ stent for 9 mm obstructing right proximal ureteral calculus  Description of procedure:  After obtaining informed consent for the patient she was taken the major cystoscopy suite placed under general anesthesia.  She is placed in the dorsolithotomy position genitalia prepped and draped in usual sterile fashion.  Proper pause and timeout was performed.  21 Fransico was advanced in the bladder without difficulty.  There is fair amount of debris within the urinary bladder.  The right ureteral orifice was easily located and centrally cannulated with a five [place] open tip catheter.  Gentle retrograde pyelogram was performed revealed probable filling defect in the right proximal ureter just distal to the ureteropelvic junction.  Advanced open tip catheter up to the level of the stone and stone appeared to migrate into the renal pelvis.  There was gross purulence around the open tip catheter noted.  A sensor wire was passed through the operative catheter and coiled in the  renal pelvis.  The operative catheter was removed.  A six [place] by 22 cm Percuflex plus soft Contour stent was placed leaving a proximal coil in the renal pelvis and a distal coil in the bladder.  There was brisk flow of purulent urine through and around the stent noted.  16 French Foley was placed at termination of the case.  Procedure was terminated she was awakened from anesthesia and taken back to the recovery room in stable condition.  No immediate complication from the procedure.

## 2020-05-17 NOTE — H&P (Signed)
History and Physical   Tonya Cameron ZOX:[id number] DOB: June 29, 1970 DOA: 05/17/2020  Referring MD/NP/PA: Dr. Anitra Lauth  PCP: [facility] [place]   Outpatient Specialists: None  Patient coming from: Home  Chief Complaint: Right-sided flank pain  HPI: Tonya Cameron is a 50 y.o. female with medical history significant of GERD, previous left kidney calculus, diet-controlled diabetes who presented to the ER was right flank pain, dysuria, nausea vomiting and diarrhea.  Symptoms have been going on for about 5 days.  She has had some left flank pain as well but more on the right.  Associated with weakness.  Denied any fever or chills.  Denied any hematemesis.  No melena.  Denied any bright red blood per rectum or other significant findings.  Patient has had recurrent kidney stones with previous procedure performed in 2011.  Today she was found to have a large 9 mm right UPJ stone with significant acute kidney injury as well as evidence of UTI.  Patient is therefore being admitted with infected nephrolithiasis and obstructing uropathy with AKI.  ED Course: Temperature 99.2 blood pressure 85/53 with pulse 106 respirate 24 oxygen sats 100% room air.  Sodium 132 potassium 3.9 chloride 103 CO2 17 glucose 105.  BUN 76 creatinine 3.91 calcium 7.9 gap 12.  Albumin is 2.5.  Hemoglobin 9.6 otherwise CBC within normal.  COVID-19 screen is negative.  CT renal stone showed mild right-sided hydronephrosis with a 9 mm stone at the UP junction.  There is some hepatomegaly and evidence of GERD.  Also distended gallbladder.  Urology consulted and patient being admitted for stenting tonight.  Review of Systems: As per HPI otherwise 10 point review of systems negative.    Past Medical History:  Diagnosis Date  . Calculus of left kidney   . Frequency of urination   . GERD (gastroesophageal reflux disease)   . Hematuria   . History of diabetes mellitus PT STATES NO PROBLEMS SINCE 2009  GASTRIC BYPASS  . History of kidney stones   . History of sleep apnea OSA W/ CPAP--  NO ISSUES SINCE 2009 GASTRIC BYPASS  . Hypertension   . Hypothyroid   . Iron deficiency anemia   . S/P gastric bypass   . Seasonal allergies   . Thyroid disease   . Urgency of urination   . Vitamin D deficiency     Past Surgical History:  Procedure Laterality Date  . CESAREAN SECTION  04-20-1997  . CYSTOSCOPY W/ URETERAL STENT PLACEMENT  10/17/2011   Procedure: CYSTOSCOPY WITH RETROGRADE PYELOGRAM/URETERAL STENT PLACEMENT;  Surgeon: Garnett Farm, MD;  Location: [facility];  Service: Urology;  Laterality: Left;  . LAPAROSCOPIC GASTRIC BYPASS  03-20-2008  . MULTIPLE TOOTH EXTRACTIONS    . PERCUTANEOUS NEPHROSTOLITHOTOMY  JAN 2012 ([facility]KE MED)   LEFT SIDE STONE EXTRACTION  . RIGHT URETEROSCOPIC STONE EXTRACTION  JAN 2012     reports that she has never smoked. She has never used smokeless tobacco. She reports that she does not drink alcohol and does not use drugs.  Allergies  Allergen Reactions  . Clindamycin/Lincomycin Other (See Comments)    Oral rash(chicken pox in mouth) & thrush  . Zoloft [Sertraline Hcl] Swelling, Rash and Other (See Comments)    Full body rash, throat/eyes/tongue swelling    Family History  Problem Relation Age of Onset  . Hypertension Other   . Diabetes Other   . Lupus Other   . Thyroid disease Mother   . Heart failure  Father      Prior to Admission medications   Medication Sig Start Date End Date Taking? Authorizing Provider  ALPRAZolam Prudy Feeler) 0.5 MG tablet Take 0.25 mg by mouth daily as needed for anxiety. 04/13/20  Yes [provider]  amLODipine (NORVASC) 10 MG tablet Take 10 mg by mouth daily. 12/02/19  Yes [provider]  amphetamine-dextroamphetamine (ADDERALL) 30 MG tablet Take 30 mg by mouth 2 (two) times daily. 04/18/20  Yes [provider]  ARIPiprazole (ABILIFY) 5 MG tablet Take 5 mg by mouth at bedtime.  04/12/20  Yes [provider]  citalopram (CELEXA) 40 MG tablet Take 40 mg by mouth at bedtime.   Yes [provider]  diphenhydrAMINE (BENADRYL) 25 MG tablet Take 25 mg by mouth every 6 (six) hours as needed for itching.   Yes [provider]  EUTHYROX 100 MCG tablet Take 100 mcg by mouth every morning. 11/21/19  Yes [provider]  ferrous sulfate 325 (65 FE) MG tablet Take 325 mg by mouth daily with breakfast.   Yes [provider]  fexofenadine (ALLEGRA) 180 MG tablet Take 180 mg by mouth daily as needed.   Yes [provider]  ibuprofen (ADVIL,MOTRIN) 200 MG tablet Take 400 mg by mouth every 6 (six) hours as needed for mild pain (for pain).   Yes [provider]  Liniments (SALONPAS ARTHRITIS PAIN RELIEF EX) Place 1 patch onto the skin daily as needed (for arthritis pain).   Yes [provider]  lisinopril (ZESTRIL) 40 MG tablet Take 40 mg by mouth daily. 12/13/19  Yes [provider]  Multiple Vitamins-Iron (MULTIVITAMIN/IRON PO) Take 1 tablet by mouth daily.   Yes [provider]  omeprazole (PRILOSEC) 20 MG capsule Take 20 mg by mouth at bedtime.   Yes [provider]  sodium chloride (OCEAN) 0.65 % SOLN nasal spray Place 1 spray into both nostrils 4 (four) times daily as needed for congestion.   Yes [provider]  triamcinolone (NASACORT) 55 MCG/ACT AERO nasal inhaler Place 2 sprays into the nose 2 (two) times daily.   Yes [provider]  lisinopril (PRINIVIL,ZESTRIL) 20 MG tablet Take 1 tablet (20 mg total) by mouth daily. Patient not taking: No sig reported 09/16/17   Mardella Layman, MD  predniSONE (STERAPRED UNI-PAK 21 TAB) 10 MG (21) TBPK tablet Take by mouth daily. Take as directed. Patient not taking: No sig reported 09/16/17   Mardella Layman, MD    Physical Exam: Vitals:   05/17/20 1630 05/17/20 1711 05/17/20 1735 05/17/20 1935  BP: (!) 90/54 (!) 85/53 100/72 117/65   Pulse: 87 86 89 94  Resp: 16 18 20 20   Temp:      TempSrc:      SpO2: 92% 95% 97%   Weight:      Height:          Constitutional: Acutely ill looking in mild distress Vitals:   05/17/20 1630 05/17/20 1711 05/17/20 1735 05/17/20 1935  BP: (!) 90/54 (!) 85/53 100/72 117/65  Pulse: 87 86 89 94  Resp: 16 18 20 20   Temp:      TempSrc:      SpO2: 92% 95% 97%   Weight:      Height:       Eyes: PERRL, lids and conjunctivae normal ENMT: Mucous membranes are dry. Posterior pharynx clear of any exudate or lesions.Normal dentition.  Neck: normal, supple, no masses, no thyromegaly Respiratory: clear to auscultation bilaterally, no wheezing,  no crackles. Normal respiratory effort. No accessory muscle use.  Cardiovascular: Regular rate and rhythm, no murmurs / rubs / gallops. No extremity edema. 2+ pedal pulses. No carotid bruits.  Abdomen: no tenderness, no masses palpated. No hepatosplenomegaly. Bowel sounds positive.  Right CVA tenderness Musculoskeletal: no clubbing / cyanosis. No joint deformity upper and lower extremities. Good ROM, no contractures. Normal muscle tone.  Skin: no rashes, lesions, ulcers. No induration Neurologic: CN 2-12 grossly intact. Sensation intact, DTR normal. Strength 5/5 in all 4.  Psychiatric: Normal judgment and insight. Alert and oriented x 3. Normal mood.     Labs on Admission: I have personally reviewed following labs and imaging studies  CBC: Recent Labs  Lab 05/17/20 1317  WBC 7.7  HGB 9.6*  HCT 30.0*  MCV 82.4  PLT 174   Basic Metabolic Panel: Recent Labs  Lab 05/17/20 1317  NA 132*  K 3.9  CL 103  CO2 17*  GLUCOSE 105*  BUN 76*  CREATININE 3.91*  CALCIUM 7.9*   GFR: Estimated Creatinine Clearance: 17.4 mL/min (A) (by C-G formula based on SCr of 3.91 mg/dL (H)). Liver Function Tests: Recent Labs  Lab 05/17/20 1317  AST 21  ALT 19  ALKPHOS 112  BILITOT 1.2  PROT 6.6  ALBUMIN 2.5*   Recent Labs  Lab 05/17/20 1317   LIPASE 41   No results for input(s): AMMONIA in the last 168 hours. Coagulation Profile: No results for input(s): INR, PROTIME in the last 168 hours. Cardiac Enzymes: No results for input(s): CKTOTAL, CKMB, CKMBINDEX, TROPONINI in the last 168 hours. BNP (last 3 results) No results for input(s): PROBNP in the last 8760 hours. HbA1C: No results for input(s): HGBA1C in the last 72 hours. CBG: No results for input(s): GLUCAP in the last 168 hours. Lipid Profile: No results for input(s): CHOL, HDL, LDLCALC, TRIG, CHOLHDL, LDLDIRECT in the last 72 hours. Thyroid Function Tests: No results for input(s): TSH, T4TOTAL, FREET4, T3FREE, THYROIDAB in the last 72 hours. Anemia Panel: No results for input(s): VITAMINB12, FOLATE, FERRITIN, TIBC, IRON, RETICCTPCT in the last 72 hours. Urine analysis:    Component Value Date/Time   COLORURINE YELLOW 05/17/2020 1417   APPEARANCEUR CLOUDY (A) 05/17/2020 1417   LABSPEC 1.014 05/17/2020 1417   PHURINE 5.0 05/17/2020 1417   GLUCOSEU NEGATIVE 05/17/2020 1417   HGBUR LARGE (A) 05/17/2020 1417   BILIRUBINUR NEGATIVE 05/17/2020 1417   KETONESUR NEGATIVE 05/17/2020 1417   PROTEINUR 100 (A) 05/17/2020 1417   NITRITE POSITIVE (A) 05/17/2020 1417   LEUKOCYTESUR LARGE (A) 05/17/2020 1417   Sepsis Labs: @LABRCNTIP (procalcitonin:4,lacticidven:4) ) Recent Results (from the past 240 hour(s))  Resp Panel by RT-PCR (Flu A&B, Covid) Nasopharyngeal Swab     Status: None   Collection Time: 05/17/20  5:52 PM   Specimen: Nasopharyngeal Swab; Nasopharyngeal(NP) swabs in vial transport medium  Result Value Ref Range Status   SARS Coronavirus 2 by RT PCR NEGATIVE NEGATIVE Final    Comment: (NOTE) SARS-CoV-2 target nucleic acids are NOT DETECTED.  The SARS-CoV-2 RNA is generally detectable in upper respiratory specimens during the acute phase of infection. The lowest concentration of SARS-CoV-2 viral copies this assay can detect is 138 copies/mL. A negative  result does not preclude SARS-Cov-2 infection and should not be used as the sole basis for treatment or other patient management decisions. A negative result may occur with  improper specimen collection/handling, submission of specimen other than nasopharyngeal swab, presence of viral mutation(s) within the areas targeted by this assay, and  inadequate number of viral copies(<138 copies/mL). A negative result must be combined with clinical observations, patient history, and epidemiological information. The expected result is Negative.  Fact Sheet for Patients:  BloggerCourse.com  Fact Sheet for Healthcare Providers:  SeriousBroker.it  This test is no t yet approved or cleared by the [place] FDA and  has been authorized for detection and/or diagnosis of SARS-CoV-2 by FDA under an Emergency Use Authorization (EUA). This EUA will remain  in effect (meaning this test can be used) for the duration of the COVID-19 declaration under Section 564(b)(1) of the Act, 21 U.S.C.section 360bbb-3(b)(1), unless the authorization is terminated  or revoked sooner.       Influenza A by PCR NEGATIVE NEGATIVE Final   Influenza B by PCR NEGATIVE NEGATIVE Final    Comment: (NOTE) The Xpert Xpress SARS-CoV-2/FLU/RSV plus assay is intended as an aid in the diagnosis of influenza from Nasopharyngeal swab specimens and should not be used as a sole basis for treatment. Nasal washings and aspirates are unacceptable for Xpert Xpress SARS-CoV-2/FLU/RSV testing.  Fact Sheet for Patients: BloggerCourse.com  Fact Sheet for Healthcare Providers: SeriousBroker.it  This test is not yet approved or cleared by the [place] FDA and has been authorized for detection and/or diagnosis of SARS-CoV-2 by FDA under an Emergency Use Authorization (EUA). This EUA will remain in effect (meaning this test can be used)  for the duration of the COVID-19 declaration under Section 564(b)(1) of the Act, 21 U.S.C. section 360bbb-3(b)(1), unless the authorization is terminated or revoked.  Performed at [facility], [address], [place], [place] [address]      Radiological Exams on Admission: CT Renal Stone Study  Result Date: 05/17/2020 CLINICAL DATA:  Bilateral flank pain.  History kidney stones. EXAM: CT ABDOMEN AND PELVIS WITHOUT CONTRAST TECHNIQUE: Multidetector CT imaging of the abdomen and pelvis was performed following the standard protocol without IV contrast. COMPARISON:  08/28/2011 from [facility] FINDINGS: Lower chest: Clear lung bases. Normal heart size without pericardial or pleural effusion. Suspect lad or distal left main coronary artery calcification on 01/02. Dilated distal esophagus with fluid level within on 11/02. Hepatobiliary: Hepatomegaly at 23.4 cm craniocaudal. No gallstones. Gallbladder distension. Equivocal pericholecystic edema including on 38/2. No biliary duct dilatation. Pancreas: Normal, without mass or ductal dilatation. Spleen: Normal in size, without focal abnormality. Adrenals/Urinary Tract: Normal adrenal glands. Mild lleft renal cortical thinning. Mild right renal and perirenal edema with hydronephrosis secondary to a stone of 9 mm at the ureteropelvic junction on 46/2. No bladder calculi. Stomach/Bowel: Surgical changes of presumably Roux-en-Y gastric bypass. Normal colon and terminal ileum. Otherwise normal small bowel. Vascular/Lymphatic: Aortic atherosclerosis. No abdominopelvic adenopathy. Reproductive: Normal uterus and adnexa. Other: No significant free fluid. Musculoskeletal: No acute osseous abnormality. Lower lumbar spondylosis. IMPRESSION: 1. Mild right-sided urinary tract obstruction secondary to a 9 mm stone at the ureteropelvic junction. 2. Hepatomegaly. 3. Esophageal air fluid level suggests dysmotility or gastroesophageal reflux. 4.  Gallbladder distension with equivocal pericholecystic edema. Correlate with right upper quadrant symptoms and consider ultrasound. 5. Suspicion of markedly advanced coronary artery atherosclerosis. Recommend assessment of coronary risk factors. 6. Aortic Atherosclerosis (ICD10-I70.0). Electronically Signed   By: Jeronimo Greaves M.D.   On: 05/17/2020 17:47      Assessment/Plan Principal Problem:   Renal calculus, right Active Problems:   GERD (gastroesophageal reflux disease)   Essential hypertension   Diabetes (HCC)   Nephrolithiasis     #1 right-sided nephrolithiasis possibly infected: Patient will be  taken to the OR for stenting.  Urinalysis consistent with pyelonephritis with positive nitrite leukocytes many bacteria.  We will admit the patient to our service.  Initiate IV Rocephin empirically.  Follow-up afterwards surgery.  Defer to urology.  #2 AKI: Most likely secondary to obstructive uropathy.  Follow renal function postoperatively.  Gentle hydration.  #3 essential hypertension: Continue with blood pressure medications at home.  #4 diet-controlled diabetes: Monitor with SSI   DVT prophylaxis: Lovenox Code Status: Full code Family Communication: No family at bedside Disposition Plan: Home Consults called: Dr. Benancio DeedsNewsome, urology Admission status: Inpatient  Severity of Illness: The appropriate patient status for this patient is INPATIENT. Inpatient status is judged to be reasonable and necessary in order to provide the required intensity of service to ensure the patient's safety. The patient's presenting symptoms, physical exam findings, and initial radiographic and laboratory data in the context of their chronic comorbidities is felt to place them at high risk for further clinical deterioration. Furthermore, it is not anticipated that the patient will be medically stable for discharge from the hospital within 2 midnights of admission. The following factors support the patient  status of inpatient.   " The patient's presenting symptoms include flank pain. " The worrisome physical exam findings include CVA tenderness. " The initial radiographic and laboratory data are worrisome because of 9 mm UPJ stone. " The chronic co-morbidities include recurrent kidney stones.   * I certify that at the point of admission it is my clinical judgment that the patient will require inpatient hospital care spanning beyond 2 midnights from the point of admission due to high intensity of service, high risk for further deterioration and high frequency of surveillance required.Lonia Blood*    Rudi Bunyard,LAWAL MD [facility] Pager [phone]  If 7PM-7AM, please contact night-coverage [email or web address] Password [email or web address]  05/17/2020, 8:44 PM

## 2020-05-17 NOTE — ED Notes (Signed)
Patient has gone to CT Scan 

## 2020-05-18 DIAGNOSIS — N390 Urinary tract infection, site not specified: Secondary | ICD-10-CM | POA: Diagnosis not present

## 2020-05-18 DIAGNOSIS — N200 Calculus of kidney: Secondary | ICD-10-CM | POA: Diagnosis not present

## 2020-05-18 DIAGNOSIS — I10 Essential (primary) hypertension: Secondary | ICD-10-CM | POA: Diagnosis not present

## 2020-05-18 LAB — BLOOD CULTURE ID PANEL (REFLEXED) - BCID2

## 2020-05-18 LAB — CBC
HCT: 27.3 % — ABNORMAL LOW (ref 36.0–46.0)
Hemoglobin: 8.8 g/dL — ABNORMAL LOW (ref 12.0–15.0)
MCH: 26.3 pg (ref 26.0–34.0)
MCHC: 32.2 g/dL (ref 30.0–36.0)
MCV: 81.7 fL (ref 80.0–100.0)
Platelets: 181 10*3/uL (ref 150–400)
RBC: 3.34 MIL/uL — ABNORMAL LOW (ref 3.87–5.11)
RDW: 17.6 % — ABNORMAL HIGH (ref 11.5–15.5)
WBC: 4.6 10*3/uL (ref 4.0–10.5)
nRBC: 0 % (ref 0.0–0.2)

## 2020-05-18 LAB — COMPREHENSIVE METABOLIC PANEL
ALT: 19 U/L (ref 0–44)
AST: 22 U/L (ref 15–41)
Albumin: 2.2 g/dL — ABNORMAL LOW (ref 3.5–5.0)
Alkaline Phosphatase: 106 U/L (ref 38–126)
Anion gap: 12 (ref 5–15)
BUN: 66 mg/dL — ABNORMAL HIGH (ref 6–20)
CO2: 18 mmol/L — ABNORMAL LOW (ref 22–32)
Calcium: 7.8 mg/dL — ABNORMAL LOW (ref 8.9–10.3)
Chloride: 106 mmol/L (ref 98–111)
Creatinine, Ser: 3.26 mg/dL — ABNORMAL HIGH (ref 0.44–1.00)
GFR, Estimated: 17 mL/min — ABNORMAL LOW (ref >60)
Glucose, Bld: 141 mg/dL — ABNORMAL HIGH (ref 70–99)
Potassium: 5.2 mmol/L — ABNORMAL HIGH (ref 3.5–5.1)
Sodium: 136 mmol/L (ref 135–145)
Total Bilirubin: 0.8 mg/dL (ref 0.3–1.2)
Total Protein: 6.2 g/dL — ABNORMAL LOW (ref 6.5–8.1)

## 2020-05-18 LAB — HIV ANTIBODY (ROUTINE TESTING W REFLEX): HIV Screen 4th Generation wRfx: NONREACTIVE

## 2020-05-18 MED ORDER — ARIPIPRAZOLE 5 MG PO TABS
5.0000 mg | ORAL_TABLET | Freq: Every day | ORAL | Status: DC
  Administered 2020-05-18: 5 mg via ORAL
  Filled 2020-05-18: qty 1

## 2020-05-18 MED ORDER — SODIUM CHLORIDE 0.9 % IV SOLN: INTRAVENOUS | Status: DC

## 2020-05-18 MED ORDER — ALPRAZOLAM 0.25 MG PO TABS
0.2500 mg | ORAL_TABLET | Freq: Every day | ORAL | Status: DC | PRN
  Administered 2020-05-18: 0.25 mg via ORAL
  Filled 2020-05-18: qty 1

## 2020-05-18 MED ORDER — PANTOPRAZOLE SODIUM 40 MG PO TBEC: 40.0000 mg | DELAYED_RELEASE_TABLET | Freq: Every day | ORAL | Status: DC

## 2020-05-18 MED ORDER — CITALOPRAM HYDROBROMIDE 20 MG PO TABS
40.0000 mg | ORAL_TABLET | Freq: Every day | ORAL | Status: DC
  Administered 2020-05-18: 40 mg via ORAL
  Filled 2020-05-18: qty 2

## 2020-05-18 MED ORDER — AMPHETAMINE-DEXTROAMPHETAMINE 10 MG PO TABS
30.0000 mg | ORAL_TABLET | Freq: Two times a day (BID) | ORAL | Status: DC
  Administered 2020-05-19 (×2): 30 mg via ORAL
  Filled 2020-05-18 (×2): qty 3

## 2020-05-18 MED ORDER — CHLORHEXIDINE GLUCONATE CLOTH 2 % EX PADS
6.0000 | MEDICATED_PAD | Freq: Every day | CUTANEOUS | Status: DC
  Administered 2020-05-18 – 2020-05-19 (×2): 6 via TOPICAL

## 2020-05-18 NOTE — Progress Notes (Signed)
PROGRESS NOTE    Tonya Cameron  NOI:[id number] DOB: December 10, 1970 DOA: 05/17/2020 PCP: [facility] [place]    Brief Narrative:  50 y.o. female with medical history significant of GERD, previous left kidney calculus, diet-controlled diabetes who presented to the ER was right flank pain, dysuria, nausea vomiting and diarrhea.  Symptoms have been going on for about 5 days.  She has had some left flank pain as well but more on the right.  Associated with weakness.  Denied any fever or chills.  Denied any hematemesis.  No melena.  Denied any bright red blood per rectum or other significant findings.  Patient has had recurrent kidney stones with previous procedure performed in 2011.  Today she was found to have a large 9 mm right UPJ stone with significant acute kidney injury as well as evidence of UTI.  Patient is therefore being admitted with infected nephrolithiasis and obstructing uropathy with AKI.  Assessment & Plan:   Principal Problem:   Renal calculus, right Active Problems:   GERD (gastroesophageal reflux disease)   Essential hypertension   Diabetes (HCC)   Nephrolithiasis   #1 right-sided nephrolithiasis possibly infected: Patient will be taken to the OR for stenting.  Urinalysis consistent with pyelonephritis with positive nitrite leukocytes many bacteria.  We will admit the patient to our service.  Initiate IV Rocephin empirically.  Follow-up afterwards surgery.  Defer to urology.  #2 AKI: Most likely secondary to obstructive uropathy.  Follow renal function postoperatively.  Pt is continued on IVF hydration. Cr is slightly improved to 3.26 today  #3 essential hypertension: Continue with blood pressure medications at home. BP currently well-controlled  #4 diet-controlled diabetes: Continue with SSI coverage, glucose stable  DVT prophylaxis: heparin subq Code Status: Full Family Communication: Pt in room, family not at bedside  Status is:  Inpatient  Remains inpatient appropriate because:IV treatments appropriate due to intensity of illness or inability to take PO and Inpatient level of care appropriate due to severity of illness   Dispo: The patient is from: Home              Anticipated d/c is to: Home              Patient currently is not medically stable to d/c.   Difficult to place patient No       Consultants:   Urology  Procedures:   Cystoscopy with R JJ stent 2/24  Antimicrobials: Anti-infectives (From admission, onward)   Start     Dose/Rate Route Frequency Ordered Stop   05/18/20 1800  cefTRIAXone (ROCEPHIN) 1 g in sodium chloride 0.9 % 100 mL IVPB        1 g 200 mL/hr over 30 Minutes Intravenous Every 24 hours 05/17/20 2228     05/17/20 2025  sodium chloride 0.9 % with cefTRIAXone (ROCEPHIN) ADS Med       Note to Pharmacy: Mirian Mo   : cabinet override      05/17/20 2025 05/18/20 0829   05/17/20 1800  cefTRIAXone (ROCEPHIN) 1 g in sodium chloride 0.9 % 100 mL IVPB        1 g 200 mL/hr over 30 Minutes Intravenous  Once 05/17/20 1751 05/17/20 1918       Subjective: Without complaints at this time, seen the morning after cystoscopy  Objective: Vitals:   05/18/20 0500 05/18/20 0653 05/18/20 0940 05/18/20 1346  BP:  102/65 95/62 (!) 103/56  Pulse:  84 80 99  Resp:  16  16  Temp:  98.3 F (36.8 C) 98.7 F (37.1 C) 98.3 F (36.8 C)  TempSrc:  Oral Oral Oral  SpO2:  97% 94% 98%  Weight: 113.3 kg     Height:        Intake/Output Summary (Last 24 hours) at 05/18/2020 1642 Last data filed at 05/18/2020 1400 Gross per 24 hour  Intake 1314.11 ml  Output 1600 ml  Net -285.89 ml   Filed Weights   05/17/20 1312 05/17/20 2230 05/18/20 0500  Weight: 97.5 kg 114 kg 113.3 kg    Examination:  General exam: Appears calm and comfortable  Respiratory system: Clear to auscultation. Respiratory effort normal. Cardiovascular system: S1 & S2 heard, Regular Gastrointestinal system: Abdomen is  nondistended, soft and nontender. No organomegaly or masses felt. Normal bowel sounds heard. Central nervous system: Alert and oriented. No focal neurological deficits. Extremities: Symmetric 5 x 5 power. Skin: No rashes, lesions  Psychiatry: Judgement and insight appear normal. Mood & affect appropriate.   Data Reviewed: I have personally reviewed following labs and imaging studies  CBC: Recent Labs  Lab 05/17/20 1317 05/17/20 2316 05/18/20 0522  WBC 7.7 7.1 4.6  HGB 9.6* 9.0* 8.8*  HCT 30.0* 28.8* 27.3*  MCV 82.4 83.0 81.7  PLT 174 172 181   Basic Metabolic Panel: Recent Labs  Lab 05/17/20 1317 05/17/20 2316 05/18/20 0321  NA 132*  --  136  K 3.9  --  5.2*  CL 103  --  106  CO2 17*  --  18*  GLUCOSE 105*  --  141*  BUN 76*  --  66*  CREATININE 3.91* 3.52* 3.26*  CALCIUM 7.9*  --  7.8*   GFR: Estimated Creatinine Clearance: 23 mL/min (A) (by C-G formula based on SCr of 3.26 mg/dL (H)). Liver Function Tests: Recent Labs  Lab 05/17/20 1317 05/18/20 0321  AST 21 22  ALT 19 19  ALKPHOS 112 106  BILITOT 1.2 0.8  PROT 6.6 6.2*  ALBUMIN 2.5* 2.2*   Recent Labs  Lab 05/17/20 1317  LIPASE 41   No results for input(s): AMMONIA in the last 168 hours. Coagulation Profile: No results for input(s): INR, PROTIME in the last 168 hours. Cardiac Enzymes: No results for input(s): CKTOTAL, CKMB, CKMBINDEX, TROPONINI in the last 168 hours. BNP (last 3 results) No results for input(s): PROBNP in the last 8760 hours. HbA1C: No results for input(s): HGBA1C in the last 72 hours. CBG: Recent Labs  Lab 05/17/20 2206  GLUCAP 109*   Lipid Profile: No results for input(s): CHOL, HDL, LDLCALC, TRIG, CHOLHDL, LDLDIRECT in the last 72 hours. Thyroid Function Tests: No results for input(s): TSH, T4TOTAL, FREET4, T3FREE, THYROIDAB in the last 72 hours. Anemia Panel: No results for input(s): VITAMINB12, FOLATE, FERRITIN, TIBC, IRON, RETICCTPCT in the last 72 hours. Sepsis  Labs: No results for input(s): PROCALCITON, LATICACIDVEN in the last 168 hours.  Recent Results (from the past 240 hour(s))  Urine culture     Status: None (Preliminary result)   Collection Time: 05/17/20  2:17 PM   Specimen: Urine, Random  Result Value Ref Range Status   Specimen Description   Final    URINE, RANDOM Performed at [facility], [address] [place], [place] [address]    Special Requests   Final    NONE Performed at [facility], [address] [place], [place] [address]    Culture   Final    CULTURE REINCUBATED FOR BETTER GROWTH Performed  at [facility], [address] [place], [place] [address]    Report Status PENDING  Incomplete  Culture, blood (routine x 2)     Status: None (Preliminary result)   Collection Time: 05/17/20  5:37 PM   Specimen: BLOOD  Result Value Ref Range Status   Specimen Description   Final    BLOOD RIGHT ANTECUBITAL Performed at [facility], [address], [place], [place] [address]    Special Requests   Final    BOTTLES DRAWN AEROBIC AND ANAEROBIC Blood Culture adequate volume Performed at [facility], [address], [place], [place] [address]    Culture  Setup Time   Final    GRAM POSITIVE COCCI IN CLUSTERS AEROBIC BOTTLE ONLY Organism ID to follow Performed at [facility], [address] [place], [place] [address]    Culture PENDING  Incomplete   Report Status PENDING  Incomplete  Culture, blood (routine x 2)     Status: None (Preliminary result)   Collection Time: 05/17/20  5:37 PM   Specimen: BLOOD  Result Value Ref Range Status   Specimen Description   Final    BLOOD LEFT ANTECUBITAL Performed at [facility], [address], [place], [place] [address]    Special Requests   Final    BOTTLES DRAWN AEROBIC AND ANAEROBIC Blood Culture adequate volume Performed at [facility], [address], [place], [place] [address]    Culture   Final    NO GROWTH < 24 HOURS Performed at [facility], [address], [place], [place] [address]    Report Status PENDING  Incomplete  Resp Panel by RT-PCR (Flu A&B, Covid) Nasopharyngeal Swab     Status: None   Collection Time: 05/17/20  5:52 PM   Specimen: Nasopharyngeal Swab; Nasopharyngeal(NP) swabs in vial transport medium  Result Value Ref Range Status   SARS Coronavirus 2 by RT PCR NEGATIVE NEGATIVE Final    Comment: (NOTE) SARS-CoV-2 target nucleic acids are NOT DETECTED.  The SARS-CoV-2 RNA is generally detectable in upper respiratory specimens during the acute phase of infection. The lowest concentration of SARS-CoV-2 viral copies this assay can detect is 138 copies/mL. A negative result does not preclude SARS-Cov-2 infection and should not be used as the sole basis for treatment or other patient management decisions. A negative result may occur with  improper specimen collection/handling, submission of specimen other than nasopharyngeal swab, presence of viral mutation(s) within the areas targeted by this assay, and inadequate number of viral copies(<138 copies/mL). A negative result must be combined with clinical observations, patient history, and epidemiological information. The expected result is Negative.  Fact Sheet for Patients:  BloggerCourse.com  Fact Sheet for Healthcare Providers:  SeriousBroker.it  This test is no t yet approved or cleared by the [place] FDA and  has been authorized for detection and/or diagnosis of SARS-CoV-2 by FDA under an Emergency Use Authorization (EUA). This EUA will remain  in effect (meaning this test can be used) for the duration of the COVID-19 declaration under Section 564(b)(1) of the Act, 21 U.S.C.section 360bbb-3(b)(1), unless the authorization is terminated  or revoked sooner.       Influenza A by PCR  NEGATIVE NEGATIVE Final   Influenza B by PCR NEGATIVE NEGATIVE Final    Comment: (NOTE) The Xpert Xpress SARS-CoV-2/FLU/RSV plus assay is intended as an aid in the diagnosis of influenza from Nasopharyngeal swab specimens and  should not be used as a sole basis for treatment. Nasal washings and aspirates are unacceptable for Xpert Xpress SARS-CoV-2/FLU/RSV testing.  Fact Sheet for Patients: BloggerCourse.com  Fact Sheet for Healthcare Providers: SeriousBroker.it  This test is not yet approved or cleared by the [place] FDA and has been authorized for detection and/or diagnosis of SARS-CoV-2 by FDA under an Emergency Use Authorization (EUA). This EUA will remain in effect (meaning this test can be used) for the duration of the COVID-19 declaration under Section 564(b)(1) of the Act, 21 U.S.C. section 360bbb-3(b)(1), unless the authorization is terminated or revoked.  Performed at [facility], [address], [place], [place] [address]      Radiology Studies: DG C-Arm 1-60 Min-No Report  Result Date: 05/17/2020 Fluoroscopy was utilized by the requesting physician.  No radiographic interpretation.   CT Renal Stone Study  Result Date: 05/17/2020 CLINICAL DATA:  Bilateral flank pain.  History kidney stones. EXAM: CT ABDOMEN AND PELVIS WITHOUT CONTRAST TECHNIQUE: Multidetector CT imaging of the abdomen and pelvis was performed following the standard protocol without IV contrast. COMPARISON:  08/28/2011 from [facility] FINDINGS: Lower chest: Clear lung bases. Normal heart size without pericardial or pleural effusion. Suspect lad or distal left main coronary artery calcification on 01/02. Dilated distal esophagus with fluid level within on 11/02. Hepatobiliary: Hepatomegaly at 23.4 cm craniocaudal. No gallstones. Gallbladder distension. Equivocal pericholecystic edema including on 38/2. No biliary duct  dilatation. Pancreas: Normal, without mass or ductal dilatation. Spleen: Normal in size, without focal abnormality. Adrenals/Urinary Tract: Normal adrenal glands. Mild lleft renal cortical thinning. Mild right renal and perirenal edema with hydronephrosis secondary to a stone of 9 mm at the ureteropelvic junction on 46/2. No bladder calculi. Stomach/Bowel: Surgical changes of presumably Roux-en-Y gastric bypass. Normal colon and terminal ileum. Otherwise normal small bowel. Vascular/Lymphatic: Aortic atherosclerosis. No abdominopelvic adenopathy. Reproductive: Normal uterus and adnexa. Other: No significant free fluid. Musculoskeletal: No acute osseous abnormality. Lower lumbar spondylosis. IMPRESSION: 1. Mild right-sided urinary tract obstruction secondary to a 9 mm stone at the ureteropelvic junction. 2. Hepatomegaly. 3. Esophageal air fluid level suggests dysmotility or gastroesophageal reflux. 4. Gallbladder distension with equivocal pericholecystic edema. Correlate with right upper quadrant symptoms and consider ultrasound. 5. Suspicion of markedly advanced coronary artery atherosclerosis. Recommend assessment of coronary risk factors. 6. Aortic Atherosclerosis (ICD10-I70.0). Electronically Signed   By: Jeronimo Greaves M.D.   On: 05/17/2020 17:47    Scheduled Meds: . Chlorhexidine Gluconate Cloth  6 each Topical Daily  . heparin  5,000 Units Subcutaneous Q8H   Continuous Infusions: . cefTRIAXone (ROCEPHIN)  IV    .  sodium bicarbonate (isotonic) infusion in sterile water 125 mL/hr at 05/18/20 0700     LOS: 1 day   Rickey Barbara, MD [facility] Pager On Amion  If 7PM-7AM, please contact night-coverage 05/18/2020, 4:42 PM

## 2020-05-18 NOTE — Progress Notes (Addendum)
1 Day Post-Op Subjective: Pt states she is feeling better,minimal pain. Cr slightly improved . Awaiting urine/blood cx  Objective: Vital signs in last 24 hours: Temp:  [97.5 F (36.4 C)-99.2 F (37.3 C)] 98.3 F (36.8 C) (02/25 1346) Pulse Rate:  [80-106] 99 (02/25 1346) Resp:  [15-24] 16 (02/25 1346) BP: (85-138)/(53-102) 103/56 (02/25 1346) SpO2:  [92 %-100 %] 98 % (02/25 1346) Weight:  [113.3 kg-114 kg] 113.3 kg (02/25 0500)  Intake/Output from previous day: 02/24 0701 - 02/25 0700 In: 774.1 [P.O.:120; I.V.:594.1; IV Piggyback:60] Out: 1300 [Urine:1300] Intake/Output this shift: Total I/O In: 540 [P.O.:540] Out: 300 [Urine:300]  Physical Exam:  General: Alert and oriented  Abdomen: Soft, ND I   Lab Results: Recent Labs    05/17/20 1317 05/17/20 2316 05/18/20 0522  HGB 9.6* 9.0* 8.8*  HCT 30.0* 28.8* 27.3*   BMET Recent Labs    05/17/20 1317 05/17/20 2316 05/18/20 0321  NA 132*  --  136  K 3.9  --  5.2*  CL 103  --  106  CO2 17*  --  18*  GLUCOSE 105*  --  141*  BUN 76*  --  66*  CREATININE 3.91* 3.52* 3.26*  CALCIUM 7.9*  --  7.8*     Studies/Results: DG C-Arm 1-60 Min-No Report  Result Date: 05/17/2020 Fluoroscopy was utilized by the requesting physician.  No radiographic interpretation.   CT Renal Stone Study  Result Date: 05/17/2020 CLINICAL DATA:  Bilateral flank pain.  History kidney stones. EXAM: CT ABDOMEN AND PELVIS WITHOUT CONTRAST TECHNIQUE: Multidetector CT imaging of the abdomen and pelvis was performed following the standard protocol without IV contrast. COMPARISON:  08/28/2011 from [facility] FINDINGS: Lower chest: Clear lung bases. Normal heart size without pericardial or pleural effusion. Suspect lad or distal left main coronary artery calcification on 01/02. Dilated distal esophagus with fluid level within on 11/02. Hepatobiliary: Hepatomegaly at 23.4 cm craniocaudal. No gallstones. Gallbladder distension. Equivocal  pericholecystic edema including on 38/2. No biliary duct dilatation. Pancreas: Normal, without mass or ductal dilatation. Spleen: Normal in size, without focal abnormality. Adrenals/Urinary Tract: Normal adrenal glands. Mild lleft renal cortical thinning. Mild right renal and perirenal edema with hydronephrosis secondary to a stone of 9 mm at the ureteropelvic junction on 46/2. No bladder calculi. Stomach/Bowel: Surgical changes of presumably Roux-en-Y gastric bypass. Normal colon and terminal ileum. Otherwise normal small bowel. Vascular/Lymphatic: Aortic atherosclerosis. No abdominopelvic adenopathy. Reproductive: Normal uterus and adnexa. Other: No significant free fluid. Musculoskeletal: No acute osseous abnormality. Lower lumbar spondylosis. IMPRESSION: 1. Mild right-sided urinary tract obstruction secondary to a 9 mm stone at the ureteropelvic junction. 2. Hepatomegaly. 3. Esophageal air fluid level suggests dysmotility or gastroesophageal reflux. 4. Gallbladder distension with equivocal pericholecystic edema. Correlate with right upper quadrant symptoms and consider ultrasound. 5. Suspicion of markedly advanced coronary artery atherosclerosis. Recommend assessment of coronary risk factors. 6. Aortic Atherosclerosis (ICD10-I70.0). Electronically Signed   By: Jeronimo Greaves M.D.   On: 05/17/2020 17:47    Assessment/Plan: S/p Cysto Right JJ stent for ureteral calculus/AKI.-Slight improvement in renal function. Rec: await urine C/S/Blood cx,continue suportive management for renal function. Will ultimately need ureteroscopic procedure for stone when medically stable. Can D/c foley if not needed for fluid management-per hospitalist.    LOS: 1 day   [name] 05/18/2020, 4:02 PM

## 2020-05-18 NOTE — Progress Notes (Signed)
PHARMACY - PHYSICIAN COMMUNICATION CRITICAL VALUE ALERT - BLOOD CULTURE IDENTIFICATION (BCID)  Tonya Cameron is an 50 y.o. female who presented to [facility] on 05/17/2020 with a chief complaint of bilateral flank pain that radiates to abdomen + N/V/D and dysuria x 5 days.   Assessment:  Admit with R kidney stone s/p stent on 2/24.  She was started on Rocephin for UTI.   Aerobic bottle of 1 blood cx set now + [facility] (BCID detects MRSE).  This is likely a contaminant.    Name of physician (or Provider) Contacted: Johnna Acosta, MD  Current antibiotics: Rocephin 1gm IV q24h  Changes to prescribed antibiotics recommended:  Patient is on recommended antibiotics - No changes needed  No results found for this or any previous visit.  [name] PharmD 05/18/2020  6:29 PM

## 2020-05-18 NOTE — Plan of Care (Signed)
Plan of care discussed.   

## 2020-05-19 DIAGNOSIS — N200 Calculus of kidney: Secondary | ICD-10-CM | POA: Diagnosis not present

## 2020-05-19 DIAGNOSIS — E0801 Diabetes mellitus due to underlying condition with hyperosmolarity with coma: Secondary | ICD-10-CM

## 2020-05-19 DIAGNOSIS — N179 Acute kidney failure, unspecified: Secondary | ICD-10-CM | POA: Diagnosis not present

## 2020-05-19 DIAGNOSIS — I10 Essential (primary) hypertension: Secondary | ICD-10-CM | POA: Diagnosis not present

## 2020-05-19 LAB — COMPREHENSIVE METABOLIC PANEL
ALT: 58 U/L — ABNORMAL HIGH (ref 0–44)
AST: 72 U/L — ABNORMAL HIGH (ref 15–41)
Albumin: 2.4 g/dL — ABNORMAL LOW (ref 3.5–5.0)
Alkaline Phosphatase: 107 U/L (ref 38–126)
Anion gap: 12 (ref 5–15)
BUN: 59 mg/dL — ABNORMAL HIGH (ref 6–20)
CO2: 23 mmol/L (ref 22–32)
Calcium: 7.7 mg/dL — ABNORMAL LOW (ref 8.9–10.3)
Chloride: 102 mmol/L (ref 98–111)
Creatinine, Ser: 2.52 mg/dL — ABNORMAL HIGH (ref 0.44–1.00)
GFR, Estimated: 23 mL/min — ABNORMAL LOW (ref >60)
Glucose, Bld: 178 mg/dL — ABNORMAL HIGH (ref 70–99)
Potassium: 4 mmol/L (ref 3.5–5.1)
Sodium: 137 mmol/L (ref 135–145)
Total Bilirubin: 0.7 mg/dL (ref 0.3–1.2)
Total Protein: 6.9 g/dL (ref 6.5–8.1)

## 2020-05-19 MED ORDER — NON FORMULARY: 20.0000 mg | Freq: Every day | Status: DC

## 2020-05-19 MED ORDER — TRAMADOL HCL 50 MG PO TABS: 50.0000 mg | ORAL_TABLET | Freq: Four times a day (QID) | ORAL | 0 refills | Status: DC | PRN

## 2020-05-19 MED ORDER — OMEPRAZOLE 20 MG PO CPDR: 20.0000 mg | DELAYED_RELEASE_CAPSULE | Freq: Every day | ORAL | Status: DC

## 2020-05-19 MED ORDER — CEFDINIR 300 MG PO CAPS: 300.0000 mg | ORAL_CAPSULE | Freq: Two times a day (BID) | ORAL | 0 refills | Status: AC

## 2020-05-19 NOTE — Evaluation (Signed)
Physical Therapy One Time Evaluation Patient Details Name: Tonya Cameron MRN: [id number] DOB: 09/30/1970 Today's Date: 05/19/2020   History of Present Illness  49 y.o. female with medical history significant of GERD, previous left kidney calculus, diet-controlled diabetes who presented to the ER was right flank pain, dysuria, nausea vomiting and diarrhea. Pt admitted with infected nephrolithiasis and obstructing uropathy with AKI and S/p Cysto Right JJ stent for ureteral calculus/AKI  Clinical Impression  Patient evaluated by Physical Therapy with no further acute PT needs identified. All education has been completed and the patient has no further questions.  Pt ambulated in hallway and reports no issues with mobility.  Pt states her son assists with IADLs and can always assist her if needed upon d/c. See below for any follow-up Physical Therapy or equipment needs. PT is signing off. Thank you for this referral.     Follow Up Recommendations No PT follow up    Equipment Recommendations  None recommended by PT    Recommendations for Other Services       Precautions / Restrictions Precautions Precautions: None      Mobility  Bed Mobility Overal bed mobility: Modified Independent                  Transfers Overall transfer level: Needs assistance Equipment used: None Transfers: Sit to/from Stand Sit to Stand: Supervision            Ambulation/Gait Ambulation/Gait assistance: Min guard;Supervision Gait Distance (Feet): 200 Feet Assistive device: Rolling walker (2 wheeled) Gait Pattern/deviations: Step-through pattern;Decreased stride length     General Gait Details: utilized RW for stability however pt not using heavily (RN reports pt missing some of her meds the past few days and has been hallucinating)  Information systems manager [employer] (Stroke Patients Only)       Balance Overall balance assessment: Needs  assistance         Standing balance support: No upper extremity supported Standing balance-Leahy Scale: Good Standing balance comment: pt able to lean forward and assist with adjusting RW to her height                             Pertinent Vitals/Pain Pain Assessment: No/denies pain    Home Living Family/patient expects to be discharged to:: Private residence Living Arrangements: Children (62 year old son)     Home Access: Stairs to enter Entrance Stairs-Rails: Right Entrance Stairs-Number of Steps: 3 Home Layout: One level Home Equipment: None      Prior Function Level of Independence: Independent               Hand Dominance        Extremity/Trunk Assessment        Lower Extremity Assessment Lower Extremity Assessment: Overall WFL for tasks assessed    Cervical / Trunk Assessment Cervical / Trunk Assessment: Normal  Communication   Communication: No difficulties  Cognition Arousal/Alertness: Awake/alert Behavior During Therapy: Restless;WFL for tasks assessed/performed Overall Cognitive Status: Within Functional Limits for tasks assessed                                        General Comments      Exercises     Assessment/Plan    PT Assessment  Patent does not need any further PT services  PT Problem List         PT Treatment Interventions      PT Goals (Current goals can be found in the Care Plan section)  Acute Rehab PT Goals PT Goal Formulation: All assessment and education complete, DC therapy    Frequency     Barriers to discharge        Co-evaluation               AM-PAC PT "6 Clicks" Mobility  Outcome Measure Help needed turning from your back to your side while in a flat bed without using bedrails?: None Help needed moving from lying on your back to sitting on the side of a flat bed without using bedrails?: None Help needed moving to and from a bed to a chair (including a wheelchair)?:  None Help needed standing up from a chair using your arms (e.g., wheelchair or bedside chair)?: None Help needed to walk in hospital room?: None Help needed climbing 3-5 steps with a railing? : A Little 6 Click Score: 23    End of Session   Activity Tolerance: Patient tolerated treatment well Patient left: in bed;with call bell/phone within reach Nurse Communication: Mobility status PT Visit Diagnosis: Difficulty in walking, not elsewhere classified (R26.2)    Time: 1352-1405 PT Time Calculation (min) (ACUTE ONLY): 13 min   Charges:   PT Evaluation $PT Eval Low Complexity: 1 Low            Gwendelyn Lanting,KATHrine E 05/19/2020, 2:13 PM Kati PT, DPT Acute Rehabilitation Services Pager: [phone] Office: [phone]

## 2020-05-19 NOTE — Progress Notes (Signed)
PROGRESS NOTE    Tonya Cameron  JXB:[id number] DOB: Jul 14, 1970 DOA: 05/17/2020 PCP: [facility] [place]    Brief Narrative:  50 y.o. female with medical history significant of GERD, previous left kidney calculus, diet-controlled diabetes who presented to the ER was right flank pain, dysuria, nausea vomiting and diarrhea.  Symptoms have been going on for about 5 days.  She has had some left flank pain as well but more on the right.  Associated with weakness.  Denied any fever or chills.  Denied any hematemesis.  No melena.  Denied any bright red blood per rectum or other significant findings.  Patient has had recurrent kidney stones with previous procedure performed in 2011.  Today she was found to have a large 9 mm right UPJ stone with significant acute kidney injury as well as evidence of UTI.  Patient is therefore being admitted with infected nephrolithiasis and obstructing uropathy with AKI.  Assessment & Plan:   Principal Problem:   Renal calculus, right Active Problems:   GERD (gastroesophageal reflux disease)   Essential hypertension   Diabetes (HCC)   Nephrolithiasis   #1 right-sided nephrolithiasis possibly infected: Patient will be taken to the OR for stenting.  Urinalysis consistent with pyelonephritis with positive nitrite leukocytes many bacteria.  Pt continued on IV Rocephin empirically. Urine cx thus far pos for >100,000 GNR  #2 AKI: Most likely secondary to obstructive uropathy.  Follow renal function postoperatively.  Pt is continued on IVF hydration. Cr is slightly improved to 2.52 today  #3 essential hypertension: Continue with blood pressure medications at home. BP currently well-controlled  #4 diet-controlled diabetes: Continue with SSI coverage, glucose remains stable  DVT prophylaxis: heparin subq Code Status: Full Family Communication: Pt in room, family not at bedside  Status is: Inpatient  Remains inpatient appropriate  because:IV treatments appropriate due to intensity of illness or inability to take PO and Inpatient level of care appropriate due to severity of illness   Dispo: The patient is from: Home              Anticipated d/c is to: Home              Patient currently is not medically stable to d/c.   Difficult to place patient No       Consultants:   Urology  Procedures:   Cystoscopy with R JJ stent 2/24  Antimicrobials: Anti-infectives (From admission, onward)   Start     Dose/Rate Route Frequency Ordered Stop   05/18/20 1800  cefTRIAXone (ROCEPHIN) 1 g in sodium chloride 0.9 % 100 mL IVPB        1 g 200 mL/hr over 30 Minutes Intravenous Every 24 hours 05/17/20 2228     05/17/20 2025  sodium chloride 0.9 % with cefTRIAXone (ROCEPHIN) ADS Med       Note to Pharmacy: Mirian Mo   : cabinet override      05/17/20 2025 05/18/20 0829   05/17/20 1800  cefTRIAXone (ROCEPHIN) 1 g in sodium chloride 0.9 % 100 mL IVPB        1 g 200 mL/hr over 30 Minutes Intravenous  Once 05/17/20 1751 05/17/20 1918      Subjective: Reports feeling much better  Objective: Vitals:   05/18/20 1346 05/18/20 1815 05/18/20 2035 05/19/20 0557  BP: (!) 103/56 114/77 124/71 113/69  Pulse: 99 82 75 78  Resp: Temp: 98.3 F (36.8 C) 98.5 F (36.9 C) 98  F (36.7 C) 98.1 F (36.7 C)  TempSrc: Oral Oral    SpO2: 98% 97% 96% 96%  Weight:      Height:        Intake/Output Summary (Last 24 hours) at 05/19/2020 0102 Last data filed at 05/18/2020 2035 Gross per 24 hour  Intake 2100 ml  Output 1000 ml  Net 1100 ml   Filed Weights   05/17/20 1312 05/17/20 2230 05/18/20 0500  Weight: 97.5 kg 114 kg 113.3 kg    Examination: General exam: Awake, laying in bed, in nad Respiratory system: Normal respiratory effort, no wheezing Cardiovascular system: regular rate, s1, s2 Gastrointestinal system: Soft, nondistended, positive BS Central nervous system: CN2-12 grossly intact, strength  intact Extremities: Perfused, no clubbing Skin: Normal skin turgor, no notable skin lesions seen Psychiatry: Mood normal // no visual hallucinations   Data Reviewed: I have personally reviewed following labs and imaging studies  CBC: Recent Labs  Lab 05/17/20 1317 05/17/20 2316 05/18/20 0522  WBC 7.7 7.1 4.6  HGB 9.6* 9.0* 8.8*  HCT 30.0* 28.8* 27.3*  MCV 82.4 83.0 81.7  PLT 174 172 181   Basic Metabolic Panel: Recent Labs  Lab 05/17/20 1317 05/17/20 2316 05/18/20 0321 05/19/20 0840  NA 132*  --  136 137  K 3.9  --  5.2* 4.0  CL 103  --  106 102  CO2 17*  --  18* 23  GLUCOSE 105*  --  141* 178*  BUN 76*  --  66* 59*  CREATININE 3.91* 3.52* 3.26* 2.52*  CALCIUM 7.9*  --  7.8* 7.7*   GFR: Estimated Creatinine Clearance: 29.8 mL/min (A) (by C-G formula based on SCr of 2.52 mg/dL (H)). Liver Function Tests: Recent Labs  Lab 05/17/20 1317 05/18/20 0321 05/19/20 0840  AST 21 22 72*  ALT 19 19 58*  ALKPHOS 112 106 107  BILITOT 1.2 0.8 0.7  PROT 6.6 6.2* 6.9  ALBUMIN 2.5* 2.2* 2.4*   Recent Labs  Lab 05/17/20 1317  LIPASE 41   No results for input(s): AMMONIA in the last 168 hours. Coagulation Profile: No results for input(s): INR, PROTIME in the last 168 hours. Cardiac Enzymes: No results for input(s): CKTOTAL, CKMB, CKMBINDEX, TROPONINI in the last 168 hours. BNP (last 3 results) No results for input(s): PROBNP in the last 8760 hours. HbA1C: No results for input(s): HGBA1C in the last 72 hours. CBG: Recent Labs  Lab 05/17/20 2206  GLUCAP 109*   Lipid Profile: No results for input(s): CHOL, HDL, LDLCALC, TRIG, CHOLHDL, LDLDIRECT in the last 72 hours. Thyroid Function Tests: No results for input(s): TSH, T4TOTAL, FREET4, T3FREE, THYROIDAB in the last 72 hours. Anemia Panel: No results for input(s): VITAMINB12, FOLATE, FERRITIN, TIBC, IRON, RETICCTPCT in the last 72 hours. Sepsis Labs: No results for input(s): PROCALCITON, LATICACIDVEN in the last  168 hours.  Recent Results (from the past 240 hour(s))  Urine culture     Status: Abnormal (Preliminary result)   Collection Time: 05/17/20  2:17 PM   Specimen: Urine, Random  Result Value Ref Range Status   Specimen Description   Final    URINE, RANDOM Performed at [facility], [address], [place], [place] [address]    Special Requests   Final    NONE Performed at [facility], [address], [place], [place] [address]    Culture (A)  Final    >=100,000 COLONIES/mL GRAM NEGATIVE RODS SUSCEPTIBILITIES TO FOLLOW CULTURE REINCUBATED FOR BETTER GROWTH Performed  at [facility], [address] [place], [place] [address]    Report Status PENDING  Incomplete  Culture, blood (routine x 2)     Status: None (Preliminary result)   Collection Time: 05/17/20  5:37 PM   Specimen: BLOOD  Result Value Ref Range Status   Specimen Description   Final    BLOOD RIGHT ANTECUBITAL Performed at [facility], [address] [place], [place] [address]    Special Requests   Final    BOTTLES DRAWN AEROBIC AND ANAEROBIC Blood Culture adequate volume Performed at [facility], [address], [place], [place] [address]    Culture  Setup Time   Final    GRAM POSITIVE COCCI IN CLUSTERS AEROBIC BOTTLE ONLY Organism ID to follow CRITICAL RESULT CALLED TO, READ BACK BY AND VERIFIED WITH: Damaris Hippo [facility] 2778 05/18/20 A BROWNING Performed at [facility], [address] [place], [place] [address]    Culture PENDING  Incomplete   Report Status PENDING  Incomplete  Culture, blood (routine x 2)     Status: None (Preliminary result)   Collection Time: 05/17/20  5:37 PM   Specimen: BLOOD  Result Value Ref Range Status   Specimen Description   Final    BLOOD LEFT ANTECUBITAL Performed at [facility], [address] [place], [place] [address]    Special Requests   Final    BOTTLES DRAWN  AEROBIC AND ANAEROBIC Blood Culture adequate volume Performed at [facility], [address] [place], [place] [address]    Culture  Setup Time   Final    GRAM POSITIVE COCCI IN CLUSTERS ANAEROBIC BOTTLE ONLY CRITICAL VALUE NOTED.  VALUE IS CONSISTENT WITH PREVIOUSLY REPORTED AND CALLED VALUE. Performed at [facility], [address], [place], [place] [address]    Culture PENDING  Incomplete   Report Status PENDING  Incomplete  Blood Culture ID Panel (Reflexed)     Status: Abnormal   Collection Time: 05/17/20  5:37 PM  Result Value Ref Range Status   Enterococcus faecalis NOT DETECTED NOT DETECTED Final   Enterococcus Faecium NOT DETECTED NOT DETECTED Final   Listeria monocytogenes NOT DETECTED NOT DETECTED Final   Staphylococcus species DETECTED (A) NOT DETECTED Final    Comment: CRITICAL RESULT CALLED TO, READ BACK BY AND VERIFIED WITH: Damaris Hippo PHARMD 1950 05/18/20 A BROWNING    Staphylococcus aureus (BCID) NOT DETECTED NOT DETECTED Final   Staphylococcus epidermidis DETECTED (A) NOT DETECTED Final    Comment: Methicillin (oxacillin) resistant coagulase negative staphylococcus. Possible blood culture contaminant (unless isolated from more than one blood culture draw or clinical case suggests pathogenicity). No antibiotic treatment is indicated for blood  culture contaminants. CRITICAL RESULT CALLED TO, READ BACK BY AND VERIFIED WITH: Damaris Hippo PHARMD [id number] 05/18/20 A BROWNING    Staphylococcus lugdunensis NOT DETECTED NOT DETECTED Final   Streptococcus species NOT DETECTED NOT DETECTED Final   Streptococcus agalactiae NOT DETECTED NOT DETECTED Final   Streptococcus pneumoniae NOT DETECTED NOT DETECTED Final   Streptococcus pyogenes NOT DETECTED NOT DETECTED Final   A.calcoaceticus-baumannii NOT DETECTED NOT DETECTED Final   Bacteroides fragilis NOT DETECTED NOT DETECTED Final   Enterobacterales NOT DETECTED NOT DETECTED Final   Enterobacter cloacae  complex NOT DETECTED NOT DETECTED Final   Escherichia coli NOT DETECTED NOT DETECTED Final   Klebsiella aerogenes NOT DETECTED NOT DETECTED Final   Klebsiella oxytoca NOT DETECTED NOT DETECTED Final  Klebsiella pneumoniae NOT DETECTED NOT DETECTED Final   Proteus species NOT DETECTED NOT DETECTED Final   Salmonella species NOT DETECTED NOT DETECTED Final   Serratia marcescens NOT DETECTED NOT DETECTED Final   Haemophilus influenzae NOT DETECTED NOT DETECTED Final   Neisseria meningitidis NOT DETECTED NOT DETECTED Final   Pseudomonas aeruginosa NOT DETECTED NOT DETECTED Final   Stenotrophomonas maltophilia NOT DETECTED NOT DETECTED Final   Candida albicans NOT DETECTED NOT DETECTED Final   Candida auris NOT DETECTED NOT DETECTED Final   Candida glabrata NOT DETECTED NOT DETECTED Final   Candida krusei NOT DETECTED NOT DETECTED Final   Candida parapsilosis NOT DETECTED NOT DETECTED Final   Candida tropicalis NOT DETECTED NOT DETECTED Final   Cryptococcus neoformans/gattii NOT DETECTED NOT DETECTED Final   Methicillin resistance mecA/C DETECTED (A) NOT DETECTED Final    Comment: CRITICAL RESULT CALLED TO, READ BACK BY AND VERIFIED WITH: Damaris HippoM [facility]ARMD [id number] 05/18/20 A BROWNING Performed at [facility], [address] [place], [place] [address]   Resp Panel by RT-PCR (Flu A&B, Covid) Nasopharyngeal Swab     Status: None   Collection Time: 05/17/20  5:52 PM   Specimen: Nasopharyngeal Swab; Nasopharyngeal(NP) swabs in vial transport medium  Result Value Ref Range Status   SARS Coronavirus 2 by RT PCR NEGATIVE NEGATIVE Final    Comment: (NOTE) SARS-CoV-2 target nucleic acids are NOT DETECTED.  The SARS-CoV-2 RNA is generally detectable in upper respiratory specimens during the acute phase of infection. The lowest concentration of SARS-CoV-2 viral copies this assay can detect is 138 copies/mL. A negative result does not preclude SARS-Cov-2 infection and should not be used as  the sole basis for treatment or other patient management decisions. A negative result may occur with  improper specimen collection/handling, submission of specimen other than nasopharyngeal swab, presence of viral mutation(s) within the areas targeted by this assay, and inadequate number of viral copies(<138 copies/mL). A negative result must be combined with clinical observations, patient history, and epidemiological information. The expected result is Negative.  Fact Sheet for Patients:  BloggerCourse.comhttps://www.fda.gov/media/152166/download  Fact Sheet for Healthcare Providers:  SeriousBroker.ithttps://www.fda.gov/media/152162/download  This test is no t yet approved or cleared by the [place] FDA and  has been authorized for detection and/or diagnosis of SARS-CoV-2 by FDA under an Emergency Use Authorization (EUA). This EUA will remain  in effect (meaning this test can be used) for the duration of the COVID-19 declaration under Section 564(b)(1) of the Act, 21 U.S.C.section 360bbb-3(b)(1), unless the authorization is terminated  or revoked sooner.       Influenza A by PCR NEGATIVE NEGATIVE Final   Influenza B by PCR NEGATIVE NEGATIVE Final    Comment: (NOTE) The Xpert Xpress SARS-CoV-2/FLU/RSV plus assay is intended as an aid in the diagnosis of influenza from Nasopharyngeal swab specimens and should not be used as a sole basis for treatment. Nasal washings and aspirates are unacceptable for Xpert Xpress SARS-CoV-2/FLU/RSV testing.  Fact Sheet for Patients: BloggerCourse.comhttps://www.fda.gov/media/152166/download  Fact Sheet for Healthcare Providers: SeriousBroker.ithttps://www.fda.gov/media/152162/download  This test is not yet approved or cleared by the [place] FDA and has been authorized for detection and/or diagnosis of SARS-CoV-2 by FDA under an Emergency Use Authorization (EUA). This EUA will remain in effect (meaning this test can be used) for the duration of the COVID-19 declaration under Section 564(b)(1) of  the Act, 21 U.S.C. section 360bbb-3(b)(1), unless the authorization is terminated or revoked.  Performed at [facility], [address] [place], [place] [address]      Radiology Studies: DG C-Arm 1-60 Min-No Report  Result Date: 05/17/2020 Fluoroscopy was utilized by the requesting physician.  No radiographic interpretation.   CT Renal Stone Study  Result Date: 05/17/2020 CLINICAL DATA:  Bilateral flank pain.  History kidney stones. EXAM: CT ABDOMEN AND PELVIS WITHOUT CONTRAST TECHNIQUE: Multidetector CT imaging of the abdomen and pelvis was performed following the standard protocol without IV contrast. COMPARISON:  08/28/2011 from [facility] FINDINGS: Lower chest: Clear lung bases. Normal heart size without pericardial or pleural effusion. Suspect lad or distal left main coronary artery calcification on 01/02. Dilated distal esophagus with fluid level within on 11/02. Hepatobiliary: Hepatomegaly at 23.4 cm craniocaudal. No gallstones. Gallbladder distension. Equivocal pericholecystic edema including on 38/2. No biliary duct dilatation. Pancreas: Normal, without mass or ductal dilatation. Spleen: Normal in size, without focal abnormality. Adrenals/Urinary Tract: Normal adrenal glands. Mild lleft renal cortical thinning. Mild right renal and perirenal edema with hydronephrosis secondary to a stone of 9 mm at the ureteropelvic junction on 46/2. No bladder calculi. Stomach/Bowel: Surgical changes of presumably Roux-en-Y gastric bypass. Normal colon and terminal ileum. Otherwise normal small bowel. Vascular/Lymphatic: Aortic atherosclerosis. No abdominopelvic adenopathy. Reproductive: Normal uterus and adnexa. Other: No significant free fluid. Musculoskeletal: No acute osseous abnormality. Lower lumbar spondylosis. IMPRESSION: 1. Mild right-sided urinary tract obstruction secondary to a 9 mm stone at the ureteropelvic junction. 2. Hepatomegaly. 3. Esophageal air fluid level  suggests dysmotility or gastroesophageal reflux. 4. Gallbladder distension with equivocal pericholecystic edema. Correlate with right upper quadrant symptoms and consider ultrasound. 5. Suspicion of markedly advanced coronary artery atherosclerosis. Recommend assessment of coronary risk factors. 6. Aortic Atherosclerosis (ICD10-I70.0). Electronically Signed   By: Jeronimo Greaves M.D.   On: 05/17/2020 17:47    Scheduled Meds: . amphetamine-dextroamphetamine  30 mg Oral BID WC  . ARIPiprazole  5 mg Oral QHS  . Chlorhexidine Gluconate Cloth  6 each Topical Daily  . citalopram  40 mg Oral QHS  . heparin  5,000 Units Subcutaneous Q8H  . pantoprazole  40 mg Oral Daily   Continuous Infusions: . sodium chloride 100 mL/hr at 05/19/20 0338  . cefTRIAXone (ROCEPHIN)  IV 1 g (05/18/20 1820)     LOS: 2 days   Rickey Barbara, MD [facility] Pager On Amion  If 7PM-7AM, please contact night-coverage 05/19/2020, 9:29 AM

## 2020-05-19 NOTE — Progress Notes (Addendum)
S: Pt feeling well. Wants to go home.   O: Eating lunch Urine clear  Vitals:   05/18/20 2035 05/19/20 0557  BP: 124/71 113/69  Pulse: 75 78  Resp: 19 18  Temp: 98 F (36.7 C) 98.1 F (36.7 C)  SpO2: 96% 96%   .  Intake/Output Summary (Last 24 hours) at 05/19/2020 1122 Last data filed at 05/19/2020 1106 Gross per 24 hour  Intake 2100 ml  Output 1400 ml  Net 700 ml   A/P -  1.  Right ureteral calculus with obstruction 2.  Acute kidney injury S/p  Cystoscopy, insertion right JJ stent  Patient's creatinine continues to improve.  She will need to follow-up with Dr. Benancio Deeds to plan stent/stone removal.  I will notify Dr. Benancio Deeds of her progress.  OK to d/c foley and d/c home from [facility] pt of view when medically stable.

## 2020-05-19 NOTE — Discharge Summary (Signed)
Physician Discharge Summary  Ciin Lakeisa Heninger ZOX:[id number] DOB: 07-17-1970 DOA: 05/17/2020  PCP: [facility]  Admit date: 05/17/2020 Discharge date: 05/19/2020  Admitted From: Home Disposition:  Home  Recommendations for Outpatient Follow-up:  1. Follow up with PCP in 1-2 weeks 2. Follow up with Urology as scheduled  Discharge Condition:Improved CODE STATUS:Full Diet recommendation: Regular   Brief/Interim Summary: 50 y.o.femalewith medical history significant ofGERD, previous left kidney calculus, diet-controlled diabetes who presented to the ER was right flank pain, dysuria, nausea vomiting and diarrhea. Symptoms have been going on for about 5 days. She has had some left flank pain as well but more on the right. Associated with weakness. Denied any fever or chills. Denied any hematemesis. No melena. Denied any bright red blood per rectum or other significant findings. Patient has had recurrent kidney stones with previous procedure performed in 2011. Today she was found to have a large 9 mm right UPJ stone with significant acute kidney injury as well as evidence of UTI. Patient was admitted with infected nephrolithiasis and obstructing uropathy with AKI  Discharge Diagnoses:  Principal Problem:   Renal calculus, right Active Problems:   GERD (gastroesophageal reflux disease)   Essential hypertension   Diabetes (HCC)   Nephrolithiasis   #1 right-sided nephrolithiasis possibly infected:Patient will be taken to the OR for stenting. Urinalysis consistent with pyelonephritis with positive nitrite leukocytes many bacteria. Pt was continued on IV Rocephin empirically. Urine cx thus far pos for >100,000 GNR. Would complete course of omnicef  Per Urololgy, OK to d/c foley and d/c home from GU perspective. Pt voiding well since foley was removed  #2 WJX:BJYN likely secondary to obstructive uropathy. Cr improving since stent placed. Cont to  hold ACEI on d/c given renal failure.   #3 essential hypertension: BP currently well-controlled. Would cont to hold off on ACEI given renal failure. Would resume home norvasc on d/c  #4 diet-controlled diabetes:Continue with SSI coverage, glucose remains stable   Discharge Instructions   Allergies as of 05/19/2020      Reactions   Clindamycin/lincomycin Other (See Comments)   Oral rash(chicken pox in mouth) & thrush   Zoloft [sertraline Hcl] Swelling, Rash, Other (See Comments)   Full body rash, throat/eyes/tongue swelling      Medication List    STOP taking these medications   ibuprofen 200 MG tablet Commonly known as: ADVIL   lisinopril 20 MG tablet Commonly known as: ZESTRIL   lisinopril 40 MG tablet Commonly known as: ZESTRIL   predniSONE 10 MG (21) Tbpk tablet Commonly known as: STERAPRED UNI-PAK 21 TAB     TAKE these medications   ALPRAZolam 0.5 MG tablet Commonly known as: XANAX Take 0.25 mg by mouth daily as needed for anxiety.   amLODipine 10 MG tablet Commonly known as: NORVASC Take 10 mg by mouth daily.   amphetamine-dextroamphetamine 30 MG tablet Commonly known as: ADDERALL Take 30 mg by mouth 2 (two) times daily.   ARIPiprazole 5 MG tablet Commonly known as: ABILIFY Take 5 mg by mouth at bedtime.   cefdinir 300 MG capsule Commonly known as: OMNICEF Take 1 capsule (300 mg total) by mouth 2 (two) times daily for 5 days.   citalopram 40 MG tablet Commonly known as: CELEXA Take 40 mg by mouth at bedtime.   diphenhydrAMINE 25 MG tablet Commonly known as: BENADRYL Take 25 mg by mouth every 6 (six) hours as needed for itching.   Euthyrox 100 MCG tablet Generic drug: levothyroxine Take  100 mcg by mouth every morning.   ferrous sulfate 325 (65 FE) MG tablet Take 325 mg by mouth daily with breakfast.   fexofenadine 180 MG tablet Commonly known as: ALLEGRA Take 180 mg by mouth daily as needed.   MULTIVITAMIN/IRON PO Take 1 tablet by  mouth daily.   omeprazole 20 MG capsule Commonly known as: PRILOSEC Take 20 mg by mouth at bedtime.   SALONPAS ARTHRITIS PAIN RELIEF EX Place 1 patch onto the skin daily as needed (for arthritis pain).   sodium chloride 0.65 % Soln nasal spray Commonly known as: OCEAN Place 1 spray into both nostrils 4 (four) times daily as needed for congestion.   traMADol 50 MG tablet Commonly known as: ULTRAM Take 1 tablet (50 mg total) by mouth every 6 (six) hours as needed for moderate pain.   triamcinolone 55 MCG/ACT Aero nasal inhaler Commonly known as: NASACORT Place 2 sprays into the nose 2 (two) times daily.       Follow-up Information    [name], MD. Call.   Specialty: Urology Contact information: [address] [place] [address] [phone]        [facility]. Schedule an appointment as soon as possible for a visit in 2 week(s).   Specialty: Family Medicine Contact information: [address] [place] [address] [phone]              Allergies  Allergen Reactions  . Clindamycin/Lincomycin Other (See Comments)    Oral rash(chicken pox in mouth) & thrush  . Zoloft [Sertraline Hcl] Swelling, Rash and Other (See Comments)    Full body rash, throat/eyes/tongue swelling    Consultations:  Urology  Procedures/Studies: DG C-Arm 1-60 Min-No Report  Result Date: 05/17/2020 Fluoroscopy was utilized by the requesting physician.  No radiographic interpretation.   CT Renal Stone Study  Result Date: 05/17/2020 CLINICAL DATA:  Bilateral flank pain.  History kidney stones. EXAM: CT ABDOMEN AND PELVIS WITHOUT CONTRAST TECHNIQUE: Multidetector CT imaging of the abdomen and pelvis was performed following the standard protocol without IV contrast. COMPARISON:  08/28/2011 from [facility] FINDINGS: Lower chest: Clear lung bases. Normal heart size without pericardial or pleural effusion. Suspect lad or distal left  main coronary artery calcification on 01/02. Dilated distal esophagus with fluid level within on 11/02. Hepatobiliary: Hepatomegaly at 23.4 cm craniocaudal. No gallstones. Gallbladder distension. Equivocal pericholecystic edema including on 38/2. No biliary duct dilatation. Pancreas: Normal, without mass or ductal dilatation. Spleen: Normal in size, without focal abnormality. Adrenals/Urinary Tract: Normal adrenal glands. Mild lleft renal cortical thinning. Mild right renal and perirenal edema with hydronephrosis secondary to a stone of 9 mm at the ureteropelvic junction on 46/2. No bladder calculi. Stomach/Bowel: Surgical changes of presumably Roux-en-Y gastric bypass. Normal colon and terminal ileum. Otherwise normal small bowel. Vascular/Lymphatic: Aortic atherosclerosis. No abdominopelvic adenopathy. Reproductive: Normal uterus and adnexa. Other: No significant free fluid. Musculoskeletal: No acute osseous abnormality. Lower lumbar spondylosis. IMPRESSION: 1. Mild right-sided urinary tract obstruction secondary to a 9 mm stone at the ureteropelvic junction. 2. Hepatomegaly. 3. Esophageal air fluid level suggests dysmotility or gastroesophageal reflux. 4. Gallbladder distension with equivocal pericholecystic edema. Correlate with right upper quadrant symptoms and consider ultrasound. 5. Suspicion of markedly advanced coronary artery atherosclerosis. Recommend assessment of coronary risk factors. 6. Aortic Atherosclerosis (ICD10-I70.0). Electronically Signed   By: Jeronimo Greaves M.D.   On: 05/17/2020 17:47    Subjective: Ignacia Bayley go go home  Discharge Exam:  Vitals:   05/19/20 0557 05/19/20 1411  BP: 113/69 135/84  Pulse: 78 98  Resp: 18 18  Temp: 98.1 F (36.7 C) 97.6 F (36.4 C)  SpO2: 96% 90%   Vitals:   05/18/20 1815 05/18/20 2035 05/19/20 0557 05/19/20 1411  BP: 114/77 124/71 113/69 135/84  Pulse: 82 75 78 98  Resp: Temp: 98.5 F (36.9 C) 98 F (36.7 C) 98.1 F (36.7 C) 97.6 F  (36.4 C)  TempSrc: Oral   Oral  SpO2: 97% 96% 96% 90%  Weight:      Height:        General: Pt is alert, awake, not in acute distress Cardiovascular: RRR, S1/S2 +, no rubs, no gallops Respiratory: CTA bilaterally, no wheezing, no rhonchi Abdominal: Soft, NT, ND, bowel sounds + Extremities: no edema, no cyanosis   The results of significant diagnostics from this hospitalization (including imaging, microbiology, ancillary and laboratory) are listed below for reference.     Microbiology: Recent Results (from the past 240 hour(s))  Urine culture     Status: Abnormal (Preliminary result)   Collection Time: 05/17/20  2:17 PM   Specimen: Urine, Random  Result Value Ref Range Status   Specimen Description   Final    URINE, RANDOM Performed at [facility], [address] [place], [place] [address]    Special Requests   Final    NONE Performed at [facility], [address], [place], [place] [address]    Culture (A)  Final    >=100,000 COLONIES/mL GRAM NEGATIVE RODS IDENTIFICATION AND SUSCEPTIBILITIES TO FOLLOW CULTURE REINCUBATED FOR BETTER GROWTH Performed at [facility], [address] [place], [place] [address]    Report Status PENDING  Incomplete  Culture, blood (routine x 2)     Status: Abnormal (Preliminary result)   Collection Time: 05/17/20  5:37 PM   Specimen: BLOOD  Result Value Ref Range Status   Specimen Description   Final    BLOOD RIGHT ANTECUBITAL Performed at [facility], [address], [place], [place] [address]    Special Requests   Final    BOTTLES DRAWN AEROBIC AND ANAEROBIC Blood Culture adequate volume Performed at [facility], [address] [place], [place] [address]    Culture  Setup Time   Final    GRAM POSITIVE COCCI IN CLUSTERS AEROBIC BOTTLE ONLY CRITICAL RESULT CALLED TO, READ BACK BY AND VERIFIED WITH: Damaris Hippo [facility] 4132 05/18/20 A  BROWNING Performed at [facility], [address] [place], [place] [address]    Culture (A)  Final    STAPHYLOCOCCUS EPIDERMIDIS STAPHYLOCOCCUS HOMINIS    Report Status PENDING  Incomplete  Culture, blood (routine x 2)     Status: Abnormal (Preliminary result)   Collection Time: 05/17/20  5:37 PM   Specimen: BLOOD  Result Value Ref Range Status   Specimen Description   Final    BLOOD LEFT ANTECUBITAL Performed at [facility], [address] [place], [place] [address]    Special Requests   Final    BOTTLES DRAWN AEROBIC AND ANAEROBIC Blood Culture adequate volume Performed at [facility], [address], [place], [place] [address]    Culture  Setup Time   Final    GRAM POSITIVE COCCI IN CLUSTERS ANAEROBIC BOTTLE ONLY CRITICAL VALUE NOTED.  VALUE IS CONSISTENT WITH PREVIOUSLY REPORTED AND CALLED VALUE. Performed at [facility], [address], [place], [place] [address]    Culture STAPHYLOCOCCUS EPIDERMIDIS (A)  Final   Report Status PENDING  Incomplete  Blood Culture ID Panel (Reflexed)     Status: Abnormal   Collection Time: 05/17/20  5:37 PM  Result Value Ref Range Status   Enterococcus faecalis NOT DETECTED NOT DETECTED Final   Enterococcus Faecium NOT DETECTED NOT DETECTED Final   Listeria monocytogenes NOT DETECTED NOT DETECTED Final   Staphylococcus species DETECTED (A) NOT DETECTED Final    Comment: CRITICAL RESULT CALLED TO, READ BACK BY AND VERIFIED WITH: Damaris Hippo PHARMD [id number] 05/18/20 A BROWNING    Staphylococcus aureus (BCID) NOT DETECTED NOT DETECTED Final   Staphylococcus epidermidis DETECTED (A) NOT DETECTED Final    Comment: Methicillin (oxacillin) resistant coagulase negative staphylococcus. Possible blood culture contaminant (unless isolated from more than one blood culture draw or clinical case suggests pathogenicity). No antibiotic treatment is indicated for blood  culture contaminants. CRITICAL  RESULT CALLED TO, READ BACK BY AND VERIFIED WITH: Damaris Hippo PHARMD 1191 05/18/20 A BROWNING    Staphylococcus lugdunensis NOT DETECTED NOT DETECTED Final   Streptococcus species NOT DETECTED NOT DETECTED Final   Streptococcus agalactiae NOT DETECTED NOT DETECTED Final   Streptococcus pneumoniae NOT DETECTED NOT DETECTED Final   Streptococcus pyogenes NOT DETECTED NOT DETECTED Final   A.calcoaceticus-baumannii NOT DETECTED NOT DETECTED Final   Bacteroides fragilis NOT DETECTED NOT DETECTED Final   Enterobacterales NOT DETECTED NOT DETECTED Final   Enterobacter cloacae complex NOT DETECTED NOT DETECTED Final   Escherichia coli NOT DETECTED NOT DETECTED Final   Klebsiella aerogenes NOT DETECTED NOT DETECTED Final   Klebsiella oxytoca NOT DETECTED NOT DETECTED Final   Klebsiella pneumoniae NOT DETECTED NOT DETECTED Final   Proteus species NOT DETECTED NOT DETECTED Final   Salmonella species NOT DETECTED NOT DETECTED Final   Serratia marcescens NOT DETECTED NOT DETECTED Final   Haemophilus influenzae NOT DETECTED NOT DETECTED Final   Neisseria meningitidis NOT DETECTED NOT DETECTED Final   Pseudomonas aeruginosa NOT DETECTED NOT DETECTED Final   Stenotrophomonas maltophilia NOT DETECTED NOT DETECTED Final   Candida albicans NOT DETECTED NOT DETECTED Final   Candida auris NOT DETECTED NOT DETECTED Final   Candida glabrata NOT DETECTED NOT DETECTED Final   Candida krusei NOT DETECTED NOT DETECTED Final   Candida parapsilosis NOT DETECTED NOT DETECTED Final   Candida tropicalis NOT DETECTED NOT DETECTED Final   Cryptococcus neoformans/gattii NOT DETECTED NOT DETECTED Final   Methicillin resistance mecA/C DETECTED (A) NOT DETECTED Final    Comment: CRITICAL RESULT CALLED TO, READ BACK BY AND VERIFIED WITH: Damaris Hippo [facility] 4782 05/18/20 A BROWNING Performed at [facility], [address] [place], [place] [address]   Resp Panel by RT-PCR (Flu A&B, Covid) Nasopharyngeal Swab      Status: None   Collection Time: 05/17/20  5:52 PM   Specimen: Nasopharyngeal Swab; Nasopharyngeal(NP) swabs in vial transport medium  Result Value Ref Range Status   SARS Coronavirus 2 by RT PCR NEGATIVE NEGATIVE Final    Comment: (NOTE) SARS-CoV-2 target nucleic acids are NOT DETECTED.  The SARS-CoV-2 RNA is generally detectable in upper respiratory specimens during the acute phase of infection. The lowest concentration of SARS-CoV-2 viral copies this assay can detect is 138 copies/mL. A negative result does not preclude SARS-Cov-2 infection and should not be used as the sole basis for treatment or other patient management decisions. A negative result may occur with  improper specimen  collection/handling, submission of specimen other than nasopharyngeal swab, presence of viral mutation(s) within the areas targeted by this assay, and inadequate number of viral copies(<138 copies/mL). A negative result must be combined with clinical observations, patient history, and epidemiological information. The expected result is Negative.  Fact Sheet for Patients:  BloggerCourse.com  Fact Sheet for Healthcare Providers:  SeriousBroker.it  This test is no t yet approved or cleared by the [place] FDA and  has been authorized for detection and/or diagnosis of SARS-CoV-2 by FDA under an Emergency Use Authorization (EUA). This EUA will remain  in effect (meaning this test can be used) for the duration of the COVID-19 declaration under Section 564(b)(1) of the Act, 21 U.S.C.section 360bbb-3(b)(1), unless the authorization is terminated  or revoked sooner.       Influenza A by PCR NEGATIVE NEGATIVE Final   Influenza B by PCR NEGATIVE NEGATIVE Final    Comment: (NOTE) The Xpert Xpress SARS-CoV-2/FLU/RSV plus assay is intended as an aid in the diagnosis of influenza from Nasopharyngeal swab specimens and should not be used as a sole basis  for treatment. Nasal washings and aspirates are unacceptable for Xpert Xpress SARS-CoV-2/FLU/RSV testing.  Fact Sheet for Patients: BloggerCourse.com  Fact Sheet for Healthcare Providers: SeriousBroker.it  This test is not yet approved or cleared by the [place] FDA and has been authorized for detection and/or diagnosis of SARS-CoV-2 by FDA under an Emergency Use Authorization (EUA). This EUA will remain in effect (meaning this test can be used) for the duration of the COVID-19 declaration under Section 564(b)(1) of the Act, 21 U.S.C. section 360bbb-3(b)(1), unless the authorization is terminated or revoked.  Performed at [facility], [address], [place], [place] [address]      Labs: BNP (last 3 results) No results for input(s): BNP in the last 8760 hours. Basic Metabolic Panel: Recent Labs  Lab 05/17/20 1317 05/17/20 2316 05/18/20 0321 05/19/20 0840  NA 132*  --  136 137  K 3.9  --  5.2* 4.0  CL 103  --  106 102  CO2 17*  --  18* 23  GLUCOSE 105*  --  141* 178*  BUN 76*  --  66* 59*  CREATININE 3.91* 3.52* 3.26* 2.52*  CALCIUM 7.9*  --  7.8* 7.7*   Liver Function Tests: Recent Labs  Lab 05/17/20 1317 05/18/20 0321 05/19/20 0840  AST 21 22 72*  ALT 19 19 58*  ALKPHOS 112 106 107  BILITOT 1.2 0.8 0.7  PROT 6.6 6.2* 6.9  ALBUMIN 2.5* 2.2* 2.4*   Recent Labs  Lab 05/17/20 1317  LIPASE 41   No results for input(s): AMMONIA in the last 168 hours. CBC: Recent Labs  Lab 05/17/20 1317 05/17/20 2316 05/18/20 0522  WBC 7.7 7.1 4.6  HGB 9.6* 9.0* 8.8*  HCT 30.0* 28.8* 27.3*  MCV 82.4 83.0 81.7  PLT 174 172 181   Cardiac Enzymes: No results for input(s): CKTOTAL, CKMB, CKMBINDEX, TROPONINI in the last 168 hours. BNP: Invalid input(s): POCBNP CBG: Recent Labs  Lab 05/17/20 2206  GLUCAP 109*   D-Dimer No results for input(s): DDIMER in the last 72 hours. Hgb A1c No  results for input(s): HGBA1C in the last 72 hours. Lipid Profile No results for input(s): CHOL, HDL, LDLCALC, TRIG, CHOLHDL, LDLDIRECT in the last 72 hours. Thyroid function studies No results for input(s): TSH, T4TOTAL, T3FREE, THYROIDAB in the last 72 hours.  Invalid input(s): FREET3 Anemia work up No results for input(s): VITAMINB12,  FOLATE, FERRITIN, TIBC, IRON, RETICCTPCT in the last 72 hours. Urinalysis    Component Value Date/Time   COLORURINE YELLOW 05/17/2020 1417   APPEARANCEUR CLOUDY (A) 05/17/2020 1417   LABSPEC 1.014 05/17/2020 1417   PHURINE 5.0 05/17/2020 1417   GLUCOSEU NEGATIVE 05/17/2020 1417   HGBUR LARGE (A) 05/17/2020 1417   BILIRUBINUR NEGATIVE 05/17/2020 1417   KETONESUR NEGATIVE 05/17/2020 1417   PROTEINUR 100 (A) 05/17/2020 1417   NITRITE POSITIVE (A) 05/17/2020 1417   LEUKOCYTESUR LARGE (A) 05/17/2020 1417   Sepsis Labs Invalid input(s): PROCALCITONIN,  WBC,  LACTICIDVEN Microbiology Recent Results (from the past 240 hour(s))  Urine culture     Status: Abnormal (Preliminary result)   Collection Time: 05/17/20  2:17 PM   Specimen: Urine, Random  Result Value Ref Range Status   Specimen Description   Final    URINE, RANDOM Performed at [facility], [address] [place], [place] [address]    Special Requests   Final    NONE Performed at [facility], [address], [place], [place] [address]    Culture (A)  Final    >=100,000 COLONIES/mL GRAM NEGATIVE RODS IDENTIFICATION AND SUSCEPTIBILITIES TO FOLLOW CULTURE REINCUBATED FOR BETTER GROWTH Performed at [facility], [address] [place], [place] [address]    Report Status PENDING  Incomplete  Culture, blood (routine x 2)     Status: Abnormal (Preliminary result)   Collection Time: 05/17/20  5:37 PM   Specimen: BLOOD  Result Value Ref Range Status   Specimen Description   Final    BLOOD RIGHT ANTECUBITAL Performed at [facility], [address], [place], [place] [address]    Special Requests   Final    BOTTLES DRAWN AEROBIC AND ANAEROBIC Blood Culture adequate volume Performed at [facility], [address], [place], [place] [address]    Culture  Setup Time   Final    GRAM POSITIVE COCCI IN CLUSTERS AEROBIC BOTTLE ONLY CRITICAL RESULT CALLED TO, READ BACK BY AND VERIFIED WITH: Damaris Hippo [facility] 1308 05/18/20 A BROWNING Performed at [facility], [address] [place], [place] [address]    Culture (A)  Final    STAPHYLOCOCCUS EPIDERMIDIS STAPHYLOCOCCUS HOMINIS    Report Status PENDING  Incomplete  Culture, blood (routine x 2)     Status: Abnormal (Preliminary result)   Collection Time: 05/17/20  5:37 PM   Specimen: BLOOD  Result Value Ref Range Status   Specimen Description   Final    BLOOD LEFT ANTECUBITAL Performed at [facility], [address], [place], [place] [address]    Special Requests   Final    BOTTLES DRAWN AEROBIC AND ANAEROBIC Blood Culture adequate volume Performed at [facility], [address] [place], [place] [address]    Culture  Setup Time   Final    GRAM POSITIVE COCCI IN CLUSTERS ANAEROBIC BOTTLE ONLY CRITICAL VALUE NOTED.  VALUE IS CONSISTENT WITH PREVIOUSLY REPORTED AND CALLED VALUE. Performed at [facility], [address], [place], [place] [address]    Culture STAPHYLOCOCCUS EPIDERMIDIS (A)  Final   Report Status PENDING  Incomplete  Blood Culture ID Panel (Reflexed)     Status: Abnormal   Collection Time: 05/17/20  5:37 PM  Result Value Ref Range Status   Enterococcus faecalis NOT DETECTED NOT DETECTED Final   Enterococcus Faecium NOT DETECTED NOT DETECTED Final   Listeria monocytogenes  NOT DETECTED NOT DETECTED Final   Staphylococcus species DETECTED (A) NOT DETECTED Final    Comment: CRITICAL RESULT CALLED TO, READ BACK BY AND VERIFIED WITH: Damaris Hippo PHARMD [id number] 05/18/20 A  BROWNING    Staphylococcus aureus (BCID) NOT DETECTED NOT DETECTED Final   Staphylococcus epidermidis DETECTED (A) NOT DETECTED Final    Comment: Methicillin (oxacillin) resistant coagulase negative staphylococcus. Possible blood culture contaminant (unless isolated from more than one blood culture draw or clinical case suggests pathogenicity). No antibiotic treatment is indicated for blood  culture contaminants. CRITICAL RESULT CALLED TO, READ BACK BY AND VERIFIED WITH: Damaris Hippo PHARMD [id number] 05/18/20 A BROWNING    Staphylococcus lugdunensis NOT DETECTED NOT DETECTED Final   Streptococcus species NOT DETECTED NOT DETECTED Final   Streptococcus agalactiae NOT DETECTED NOT DETECTED Final   Streptococcus pneumoniae NOT DETECTED NOT DETECTED Final   Streptococcus pyogenes NOT DETECTED NOT DETECTED Final   A.calcoaceticus-baumannii NOT DETECTED NOT DETECTED Final   Bacteroides fragilis NOT DETECTED NOT DETECTED Final   Enterobacterales NOT DETECTED NOT DETECTED Final   Enterobacter cloacae complex NOT DETECTED NOT DETECTED Final   Escherichia coli NOT DETECTED NOT DETECTED Final   Klebsiella aerogenes NOT DETECTED NOT DETECTED Final   Klebsiella oxytoca NOT DETECTED NOT DETECTED Final   Klebsiella pneumoniae NOT DETECTED NOT DETECTED Final   Proteus species NOT DETECTED NOT DETECTED Final   Salmonella species NOT DETECTED NOT DETECTED Final   Serratia marcescens NOT DETECTED NOT DETECTED Final   Haemophilus influenzae NOT DETECTED NOT DETECTED Final   Neisseria meningitidis NOT DETECTED NOT DETECTED Final   Pseudomonas aeruginosa NOT DETECTED NOT DETECTED Final   Stenotrophomonas maltophilia NOT DETECTED NOT DETECTED Final   Candida albicans NOT DETECTED NOT DETECTED Final   Candida auris NOT DETECTED NOT DETECTED Final   Candida glabrata NOT DETECTED NOT DETECTED Final   Candida krusei NOT DETECTED NOT DETECTED Final   Candida parapsilosis NOT DETECTED NOT DETECTED Final   Candida  tropicalis NOT DETECTED NOT DETECTED Final   Cryptococcus neoformans/gattii NOT DETECTED NOT DETECTED Final   Methicillin resistance mecA/C DETECTED (A) NOT DETECTED Final    Comment: CRITICAL RESULT CALLED TO, READ BACK BY AND VERIFIED WITH: Damaris Hippo [facility] 5701 05/18/20 A BROWNING Performed at [facility], [address], [place], [place] [address]   Resp Panel by RT-PCR (Flu A&B, Covid) Nasopharyngeal Swab     Status: None   Collection Time: 05/17/20  5:52 PM   Specimen: Nasopharyngeal Swab; Nasopharyngeal(NP) swabs in vial transport medium  Result Value Ref Range Status   SARS Coronavirus 2 by RT PCR NEGATIVE NEGATIVE Final    Comment: (NOTE) SARS-CoV-2 target nucleic acids are NOT DETECTED.  The SARS-CoV-2 RNA is generally detectable in upper respiratory specimens during the acute phase of infection. The lowest concentration of SARS-CoV-2 viral copies this assay can detect is 138 copies/mL. A negative result does not preclude SARS-Cov-2 infection and should not be used as the sole basis for treatment or other patient management decisions. A negative result may occur with  improper specimen collection/handling, submission of specimen other than nasopharyngeal swab, presence of viral mutation(s) within the areas targeted by this assay, and inadequate number of viral copies(<138 copies/mL). A negative result must be combined with clinical observations, patient history, and epidemiological information. The expected result is Negative.  Fact Sheet for Patients:  BloggerCourse.com  Fact Sheet for Healthcare Providers:  SeriousBroker.it  This test is no t yet approved or cleared by the  [place] FDA and  has been authorized for detection and/or diagnosis of SARS-CoV-2 by FDA under an TEFL teachermergency Use Authorization (EUA). This EUA will remain  in effect (meaning this test can be used) for the duration of the COVID-19  declaration under Section 564(b)(1) of the Act, 21 U.S.C.section 360bbb-3(b)(1), unless the authorization is terminated  or revoked sooner.       Influenza A by PCR NEGATIVE NEGATIVE Final   Influenza B by PCR NEGATIVE NEGATIVE Final    Comment: (NOTE) The Xpert Xpress SARS-CoV-2/FLU/RSV plus assay is intended as an aid in the diagnosis of influenza from Nasopharyngeal swab specimens and should not be used as a sole basis for treatment. Nasal washings and aspirates are unacceptable for Xpert Xpress SARS-CoV-2/FLU/RSV testing.  Fact Sheet for Patients: BloggerCourse.comhttps://www.fda.gov/media/152166/download  Fact Sheet for Healthcare Providers: SeriousBroker.ithttps://www.fda.gov/media/152162/download  This test is not yet approved or cleared by the [place] FDA and has been authorized for detection and/or diagnosis of SARS-CoV-2 by FDA under an Emergency Use Authorization (EUA). This EUA will remain in effect (meaning this test can be used) for the duration of the COVID-19 declaration under Section 564(b)(1) of the Act, 21 U.S.C. section 360bbb-3(b)(1), unless the authorization is terminated or revoked.  Performed at [facility], [address] [place], [place] [address]    Time spent: 30 min  SIGNED:   Rickey BarbaraStephen Sheriann Newmann, MD  [facility] 05/19/2020, 3:45 PM  If 7PM-7AM, please contact night-coverage

## 2020-05-19 NOTE — Progress Notes (Signed)
Pt and pts son verbalize understanding of all discharge instructions. All questions answered. To car via w/c with discharge instructions and all belongings.

## 2020-05-19 NOTE — Discharge Instructions (Signed)
Ureteral Stent Implantation, Care After This sheet gives you information about how to care for yourself after your procedure. Your health care provider may also give you more specific instructions. If you have problems or questions, contact your health care provider.  Be sure to follow-up with Dr. Benancio Deeds to plan stent/stone removal.  What can I expect after the procedure? After the procedure, it is common to have:  Nausea.  Mild pain when you urinate. You may feel this pain in your lower back or lower abdomen. The pain should stop within a few minutes after you urinate. This may last for up to 1 week.  A small amount of blood in your urine for several days. Follow these instructions at home: Medicines  Take over-the-counter and prescription medicines only as told by your health care provider.  If you were prescribed an antibiotic medicine, take it as told by your health care provider. Do not stop taking the antibiotic even if you start to feel better.  Do not drive for 24 hours if you were given a sedative during your procedure.  Ask your health care provider if the medicine prescribed to you requires you to avoid driving or using heavy machinery. Activity  Rest as told by your health care provider.  Avoid sitting for a long time without moving. Get up to take short walks every 1-2 hours. This is important to improve blood flow and breathing. Ask for help if you feel weak or unsteady.  Return to your normal activities as told by your health care provider. Ask your health care provider what activities are safe for you. General instructions  Watch for any blood in your urine. Call your health care provider if the amount of blood in your urine increases.  If you have a catheter: ? Follow instructions from your health care provider about taking care of your catheter and collection bag. ? Do not take baths, swim, or use a hot tub until your health care provider approves. Ask your  health care provider if you may take showers. You may only be allowed to take sponge baths.  Drink enough fluid to keep your urine pale yellow.  Do not use any products that contain nicotine or tobacco, such as cigarettes, e-cigarettes, and chewing tobacco. These can delay healing after surgery. If you need help quitting, ask your health care provider.  Keep all follow-up visits as told by your health care provider. This is important.   Contact a health care provider if:  You have pain that gets worse or does not get better with medicine, especially pain when you urinate.  You have difficulty urinating.  You feel nauseous or you vomit repeatedly during a period of more than 2 days after the procedure. Get help right away if:  Your urine is dark red or has blood clots in it.  You are leaking urine (have incontinence).  The end of the stent comes out of your urethra.  You cannot urinate.  You have sudden, sharp, or severe pain in your abdomen or lower back.  You have a fever.  You have swelling or pain in your legs.  You have difficulty breathing. Summary  After the procedure, it is common to have mild pain when you urinate that goes away within a few minutes after you urinate. This may last for up to 1 week.  Watch for any blood in your urine. Call your health care provider if the amount of blood in your urine increases.  Take  over-the-counter and prescription medicines only as told by your health care provider.  Drink enough fluid to keep your urine pale yellow. This information is not intended to replace advice given to you by your health care provider. Make sure you discuss any questions you have with your health care provider. Document Revised: 12/15/2017 Document Reviewed: 12/16/2017 Elsevier Patient Education  2021 [employer].

## 2020-05-20 LAB — URINE CULTURE: Culture: >=100000 — AB

## 2020-05-20 LAB — CULTURE, BLOOD (ROUTINE X 2)
Special Requests: ADEQUATE
Special Requests: ADEQUATE

## 2020-05-20 NOTE — Anesthesia Postprocedure Evaluation (Signed)
Anesthesia Post Note  Patient: Saryah Babbie Dondlinger  Procedure(s) Performed: CYSTOSCOPY WITH RETROGRADE PYELOGRAM/URETERAL STENT PLACEMENT (Right Ureter)     Patient location during evaluation: PACU Anesthesia Type: General Level of consciousness: awake and alert Pain management: pain level controlled Vital Signs Assessment: post-procedure vital signs reviewed and stable Respiratory status: spontaneous breathing, nonlabored ventilation, respiratory function stable and patient connected to nasal cannula oxygen Cardiovascular status: blood pressure returned to baseline and stable Postop Assessment: no apparent nausea or vomiting Anesthetic complications: no   No complications documented.  Last Vitals:  Vitals:   05/19/20 0557 05/19/20 1411  BP: 113/69 135/84  Pulse: 78 98  Resp: 18 18  Temp: 36.7 C 36.4 C  SpO2: 96% 90%    Last Pain:  Vitals:   05/19/20 1411  TempSrc: Oral  PainSc:                  Trevor Iha

## 2020-05-21 ENCOUNTER — Encounter (HOSPITAL_COMMUNITY): Payer: Self-pay | Admitting: Urology

## 2020-05-30 ENCOUNTER — Other Ambulatory Visit: Payer: Self-pay | Admitting: Urology

## 2020-05-31 NOTE — Progress Notes (Signed)
Pt. Needs orders for upcomming surgery. PAT and labs on 06/01/20.Thanks.

## 2020-05-31 NOTE — Patient Instructions (Signed)
DUE TO COVID-19 ONLY ONE VISITOR IS ALLOWED TO COME WITH YOU AND STAY IN THE WAITING ROOM ONLY DURING PRE OP AND PROCEDURE DAY OF SURGERY. THE 1 VISITOR  MAY VISIT WITH YOU AFTER SURGERY IN YOUR PRIVATE ROOM DURING VISITING HOURS ONLY!  YOU NEED TO HAVE A COVID 19 TEST ON: 06/01/20 @  12:55 PM, THIS TEST MUST BE DONE BEFORE SURGERY,  COVID TESTING SITE [address] [place] [address], IT IS ON THE RIGHT GOING OUT [address] APPROXIMATELY  2 MINUTES PAST [employer] ON THE RIGHT. ONCE YOUR COVID TEST IS COMPLETED,  PLEASE BEGIN THE QUARANTINE INSTRUCTIONS AS OUTLINED IN YOUR HANDOUT.                Tonya Cameron    Your procedure is scheduled on: 06/05/20   Report to [facility] Main  Entrance   Report to admitting at: 11:00 AM     Call this number if you have problems the morning of surgery [phone]    Remember: Do not eat solid food :After Midnight. Clear liquids until: 10:00 am.  CLEAR LIQUID DIET  Foods Allowed                                                                     Foods Excluded  Coffee and tea, regular and decaf                             liquids that you cannot  Plain Jell-O any favor except red or purple                                           see through such as: Fruit ices (not with fruit pulp)                                     milk, soups, orange juice  Iced Popsicles                                    All solid food Carbonated beverages, regular and diet                                    Cranberry, grape and apple juices Sports drinks like Gatorade Lightly seasoned clear broth or consume(fat free) Sugar, honey syrup  Sample Menu Breakfast                                Lunch                                     Supper Cranberry juice  Beef broth                            Chicken broth Jell-O                                     Grape juice                           Apple juice Coffee or tea                         Jell-O                                      Popsicle                                                Coffee or tea                        Coffee or tea  _____________________________________________________________________  BRUSH YOUR TEETH MORNING OF SURGERY AND RINSE YOUR MOUTH OUT, NO CHEWING GUM CANDY OR MINTS.    Take these medicines the morning of surgery with A SIP OF WATER: amlodipine,aripiprazole,citalopram,euthyrox,fexofenadine.Alprazolam as needed.                               You may not have any metal on your body including hair pins and              piercings  Do not wear jewelry, make-up, lotions, powders or perfumes, deodorant             Do not wear nail polish on your fingernails.  Do not shave  48 hours prior to surgery.    Do not bring valuables to the hospital. [place] IS NOT             RESPONSIBLE   FOR VALUABLES.  Contacts, dentures or bridgework may not be worn into surgery.  Leave suitcase in the car. After surgery it may be brought to your room.     Patients discharged the day of surgery will not be allowed to drive home. IF YOU ARE HAVING SURGERY AND GOING HOME THE SAME DAY, YOU MUST HAVE AN ADULT TO DRIVE YOU HOME AND BE WITH YOU FOR 24 HOURS. YOU MAY GO HOME BY TAXI OR UBER OR ORTHERWISE, BUT AN ADULT MUST ACCOMPANY YOU HOME AND STAY WITH YOU FOR 24 HOURS.  Name and phone number of your driver:  Special Instructions: N/A              Please read over the following fact sheets you were given: _____________________________________________________________________         [facility] - Preparing for Surgery Before surgery, you can play an important role.  Because skin is not sterile, your skin needs to be as free of germs as possible.  You can reduce the number of germs on your skin by washing with CHG (chlorahexidine gluconate) soap before surgery.  CHG is an  antiseptic cleaner which kills germs and bonds with the skin to continue killing  germs even after washing. Please DO NOT use if you have an allergy to CHG or antibacterial soaps.  If your skin becomes reddened/irritated stop using the CHG and inform your nurse when you arrive at Short Stay. Do not shave (including legs and underarms) for at least 48 hours prior to the first CHG shower.  You may shave your face/neck. Please follow these instructions carefully:  1.  Shower with CHG Soap the night before surgery and the  morning of Surgery.  2.  If you choose to wash your hair, wash your hair first as usual with your  normal  shampoo.  3.  After you shampoo, rinse your hair and body thoroughly to remove the  shampoo.                           4.  Use CHG as you would any other liquid soap.  You can apply chg directly  to the skin and wash                       Gently with a scrungie or clean washcloth.  5.  Apply the CHG Soap to your body ONLY FROM THE NECK DOWN.   Do not use on face/ open                           Wound or open sores. Avoid contact with eyes, ears mouth and genitals (private parts).                       Wash face,  Genitals (private parts) with your normal soap.             6.  Wash thoroughly, paying special attention to the area where your surgery  will be performed.  7.  Thoroughly rinse your body with warm water from the neck down.  8.  DO NOT shower/wash with your normal soap after using and rinsing off  the CHG Soap.                9.  Pat yourself dry with a clean towel.            10.  Wear clean pajamas.            11.  Place clean sheets on your bed the night of your first shower and do not  sleep with pets. Day of Surgery : Do not apply any lotions/deodorants the morning of surgery.  Please wear clean clothes to the hospital/surgery center.  FAILURE TO FOLLOW THESE INSTRUCTIONS MAY RESULT IN THE CANCELLATION OF YOUR SURGERY PATIENT SIGNATURE_________________________________  NURSE  SIGNATURE__________________________________  ________________________________________________________________________

## 2020-06-01 ENCOUNTER — Encounter (HOSPITAL_COMMUNITY): Payer: Self-pay

## 2020-06-01 ENCOUNTER — Encounter (HOSPITAL_COMMUNITY)
Admission: RE | Admit: 2020-06-01 | Discharge: 2020-06-01 | Disposition: A | Discharge status: Home / Self Care | Payer: BC Managed Care – PPO | Source: Ambulatory Visit | Attending: Urology | Admitting: Urology

## 2020-06-01 ENCOUNTER — Other Ambulatory Visit: Payer: Self-pay

## 2020-06-01 ENCOUNTER — Other Ambulatory Visit (HOSPITAL_COMMUNITY)
Admission: RE | Admit: 2020-06-01 | Discharge: 2020-06-01 | Disposition: A | Discharge status: Home / Self Care | Payer: BC Managed Care – PPO | Source: Ambulatory Visit | Attending: Urology | Admitting: Urology

## 2020-06-01 DIAGNOSIS — Z01818 Encounter for other preprocedural examination: Secondary | ICD-10-CM | POA: Diagnosis not present

## 2020-06-01 DIAGNOSIS — Z01812 Encounter for preprocedural laboratory examination: Secondary | ICD-10-CM | POA: Diagnosis not present

## 2020-06-01 DIAGNOSIS — Z20822 Contact with and (suspected) exposure to covid-19: Secondary | ICD-10-CM | POA: Diagnosis not present

## 2020-06-01 HISTORY — DX: Prediabetes: R73.03

## 2020-06-01 HISTORY — DX: Anxiety disorder, unspecified: F41.9

## 2020-06-01 HISTORY — DX: Depression, unspecified: F32.A

## 2020-06-01 LAB — BASIC METABOLIC PANEL
Anion gap: 7 (ref 5–15)
BUN: 14 mg/dL (ref 6–20)
CO2: 22 mmol/L (ref 22–32)
Calcium: 8.8 mg/dL — ABNORMAL LOW (ref 8.9–10.3)
Chloride: 107 mmol/L (ref 98–111)
Creatinine, Ser: 1.63 mg/dL — ABNORMAL HIGH (ref 0.44–1.00)
GFR, Estimated: 38 mL/min — ABNORMAL LOW (ref >60)
Glucose, Bld: 105 mg/dL — ABNORMAL HIGH (ref 70–99)
Potassium: 4.2 mmol/L (ref 3.5–5.1)
Sodium: 136 mmol/L (ref 135–145)

## 2020-06-01 LAB — CBC
HCT: 28.9 % — ABNORMAL LOW (ref 36.0–46.0)
Hemoglobin: 8.6 g/dL — ABNORMAL LOW (ref 12.0–15.0)
MCH: 25.9 pg — ABNORMAL LOW (ref 26.0–34.0)
MCHC: 29.8 g/dL — ABNORMAL LOW (ref 30.0–36.0)
MCV: 87 fL (ref 80.0–100.0)
Platelets: 523 10*3/uL — ABNORMAL HIGH (ref 150–400)
RBC: 3.32 MIL/uL — ABNORMAL LOW (ref 3.87–5.11)
RDW: 17.2 % — ABNORMAL HIGH (ref 11.5–15.5)
WBC: 11.7 10*3/uL — ABNORMAL HIGH (ref 4.0–10.5)
nRBC: 0 % (ref 0.0–0.2)

## 2020-06-01 LAB — HEMOGLOBIN A1C
Hgb A1c MFr Bld: 5.9 % — ABNORMAL HIGH (ref 4.8–5.6)
Mean Plasma Glucose: 122.63 mg/dL

## 2020-06-01 LAB — SARS CORONAVIRUS 2 (TAT 6-24 HRS): SARS Coronavirus 2: NEGATIVE

## 2020-06-01 NOTE — Progress Notes (Addendum)
COVID Vaccine Completed:Yes Date COVID Vaccine completed: 03/28/20 [place] COVID vaccine manufacturer: Pfizer      PCP - [facility] Sprang family at [facility] Cardiologist -   Chest x-ray -  EKG -  Stress Test -  ECHO -  Cardiac Cath -  Pacemaker/ICD device last checked:  Sleep Study - Yes CPAP - Yes  Fasting Blood Sugar -  Checks Blood Sugar _____ times a day  Blood Thinner Instructions: Aspirin Instructions: Last Dose:  Anesthesia review: Hx: OSA(CPAP),HTN  Patient denies shortness of breath, fever, cough and chest pain at PAT appointment   Patient verbalized understanding of instructions that were given to them at the PAT appointment. Patient was also instructed that they will need to review over the PAT instructions again at home before surgery.

## 2020-06-02 ENCOUNTER — Telehealth: Payer: Self-pay | Admitting: Urology

## 2020-06-02 NOTE — Telephone Encounter (Addendum)
Returned call from patient. She is s/p ureteral stent placement on 2/24 for 11mm obstructing right UPJ stone. Ucx 2/24 with enterobacter r/t ancef and zosyn. She completed omnicef 300mg  BID x 5d on 3/3. She is scheduled for definitive stone treatment with Dr. 5/3 on 3/15. Last seen in urology clinic on 3/7, where UA overall not very impressive for persistent UTI but Ucx not sent (patient reports being unable to provide much urine for sample).  She reports low grade temperatures from 100-100.2 since stopping antibiotics with a spike to 102.7 last night. She is overall feeling ok without increased bladder spasms or gross hematuria.   I recommended that she come to the ED to drop off a urine sample for UA/urine culture and get a KUB to ensure correct stent positioning + labs. If KUB, labs, and vitals unremarkable, then she can likely discharge home with empiric antibiotics (recommend Bactrim DS BID x 14 days per prior urine culture), and we will follow up her urine culture results.   If her urine culture returns positive and NOT covered by her empiric antibiotic, then we will likely need to delay her procedure to allow appropriate treatment of her UTI. If her urine culture returns positive and covered by her empiric antibiotic, then we may be able to proceed with her procedure as scheduled as long as she remains AFHDS.  She will try to go to the ED later today.

## 2020-06-03 NOTE — Telephone Encounter (Signed)
Called patient again today, no answer, left VM.  It doesn't appear that she has come to the ED for evaluation of fevers. Thus, I discussed that we will likely need to delay her procedure scheduled for 3/15 with Dr. Benancio Deeds given her recent fevers of unknown origin, possibly due to a UTI. I recommended she still come to the ED for evaluation. I encouraged her to call back with questions/concerns. I discussed that I will route this message to Dr. Benancio Deeds to make him aware.

## 2020-06-05 ENCOUNTER — Ambulatory Visit (HOSPITAL_COMMUNITY): Admission: RE | Admit: 2020-06-05 | Payer: BC Managed Care – PPO | Source: Ambulatory Visit | Admitting: Urology

## 2020-06-05 ENCOUNTER — Encounter (HOSPITAL_COMMUNITY): Admission: RE | Payer: Self-pay | Source: Ambulatory Visit

## 2020-06-05 SURGERY — CYSTOSCOPY/URETEROSCOPY/HOLMIUM LASER/STENT PLACEMENT
Anesthesia: General | Laterality: Right

## 2020-06-21 ENCOUNTER — Other Ambulatory Visit: Payer: Self-pay | Admitting: Urology

## 2020-06-22 NOTE — Progress Notes (Signed)
DUE TO COVID-19 ONLY ONE VISITOR IS ALLOWED TO COME WITH YOU AND STAY IN THE WAITING ROOM ONLY DURING PRE OP AND PROCEDURE DAY OF SURGERY. THE 1 VISITOR  MAY VISIT WITH YOU AFTER SURGERY IN YOUR PRIVATE ROOM DURING VISITING HOURS ONLY!  YOU NEED TO HAVE A COVID 19 TEST ON___4/10/2020 ____ @_______ , THIS TEST MUST BE DONE BEFORE SURGERY,  COVID TESTING SITE [address] [place] , IT IS ON THE RIGHT GOING OUT [address] APPROXIMATELY  2 MINUTES PAST [employer] ON THE RIGHT. ONCE YOUR COVID TEST IS COMPLETED,  PLEASE BEGIN THE QUARANTINE INSTRUCTIONS AS OUTLINED IN YOUR HANDOUT.                Tonya Cameron  06/22/2020   Your procedure is scheduled on:                       07/03/2020   Report to [facility] Main  Entrance   Report to admitting at     1020 AM     Call this number if you have problems the morning of surgery [phone]    Remember: Do not eat food , candy gum or mints :After Midnight. You may have clear liquids from midnight until 0920 am     CLEAR LIQUID DIET   Foods Allowed                                                                       Coffee and tea, regular and decaf                              Plain Jell-O any favor except red or purple                                            Fruit ices (not with fruit pulp)                                      Iced Popsicles                                     Carbonated beverages, regular and diet                                    Cranberry, grape and apple juices Sports drinks like Gatorade Lightly seasoned clear broth or consume(fat free) Sugar, honey syrup   _____________________________________________________________________    BRUSH YOUR TEETH MORNING OF SURGERY AND RINSE YOUR MOUTH OUT, NO CHEWING GUM CANDY OR MINTS.     Take these medicines the morning of surgery with A SIP OF WATER:  Amlodipine, adderall, Allegra, nasal spray if needed  DO NOT TAKE  ANY DIABETIC MEDICATIONS DAY OF YOUR SURGERY  You may not have any metal on your body including hair pins and              piercings  Do not wear jewelry, make-up, lotions, powders or perfumes, deodorant             Do not wear nail polish on your fingernails.  Do not shave  48 hours prior to surgery.              Men may shave face and neck.   Do not bring valuables to the hospital. [place].  Contacts, dentures or bridgework may not be worn into surgery.  Leave suitcase in the car. After surgery it may be brought to your room.     Patients discharged the day of surgery will not be allowed to drive home. IF YOU ARE HAVING SURGERY AND GOING HOME THE SAME DAY, YOU MUST HAVE AN ADULT TO DRIVE YOU HOME AND BE WITH YOU FOR 24 HOURS. YOU MAY GO HOME BY TAXI OR UBER OR ORTHERWISE, BUT AN ADULT MUST ACCOMPANY YOU HOME AND STAY WITH YOU FOR 24 HOURS.  Name and phone number of your driver:  Special Instructions: N/A              Please read over the following fact sheets you were given: _____________________________________________________________________  [facility] - Preparing for Surgery Before surgery, you can play an important role.  Because skin is not sterile, your skin needs to be as free of germs as possible.  You can reduce the number of germs on your skin by washing with CHG (chlorahexidine gluconate) soap before surgery.  CHG is an antiseptic cleaner which kills germs and bonds with the skin to continue killing germs even after washing. Please DO NOT use if you have an allergy to CHG or antibacterial soaps.  If your skin becomes reddened/irritated stop using the CHG and inform your nurse when you arrive at Short Stay. Do not shave (including legs and underarms) for at least 48 hours prior to the first CHG shower.  You may shave your face/neck. Please follow these instructions carefully:  1.  Shower with CHG  Soap the night before surgery and the  morning of Surgery.  2.  If you choose to wash your hair, wash your hair first as usual with your  normal  shampoo.  3.  After you shampoo, rinse your hair and body thoroughly to remove the  shampoo.                           4.  Use CHG as you would any other liquid soap.  You can apply chg directly  to the skin and wash                       Gently with a scrungie or clean washcloth.  5.  Apply the CHG Soap to your body ONLY FROM THE NECK DOWN.   Do not use on face/ open                           Wound or open sores. Avoid contact with eyes, ears mouth and genitals (private parts).  Wash face,  Genitals (private parts) with your normal soap.             6.  Wash thoroughly, paying special attention to the area where your surgery  will be performed.  7.  Thoroughly rinse your body with warm water from the neck down.  8.  DO NOT shower/wash with your normal soap after using and rinsing off  the CHG Soap.                9.  Pat yourself dry with a clean towel.            10.  Wear clean pajamas.            11.  Place clean sheets on your bed the night of your first shower and do not  sleep with pets. Day of Surgery : Do not apply any lotions/deodorants the morning of surgery.  Please wear clean clothes to the hospital/surgery center.  FAILURE TO FOLLOW THESE INSTRUCTIONS MAY RESULT IN THE CANCELLATION OF YOUR SURGERY PATIENT SIGNATURE_________________________________  NURSE SIGNATURE__________________________________  ________________________________________________________________________

## 2020-06-27 ENCOUNTER — Other Ambulatory Visit: Payer: Self-pay

## 2020-06-27 ENCOUNTER — Encounter (HOSPITAL_COMMUNITY)
Admission: RE | Admit: 2020-06-27 | Discharge: 2020-06-27 | Disposition: A | Discharge status: Home / Self Care | Payer: BC Managed Care – PPO | Source: Ambulatory Visit | Attending: Urology | Admitting: Urology

## 2020-06-27 ENCOUNTER — Encounter (HOSPITAL_COMMUNITY): Payer: Self-pay

## 2020-06-27 DIAGNOSIS — Z01812 Encounter for preprocedural laboratory examination: Secondary | ICD-10-CM | POA: Diagnosis not present

## 2020-06-27 HISTORY — DX: Chronic sinusitis, unspecified: J32.9

## 2020-06-27 HISTORY — DX: Pneumonia, unspecified organism: J18.9

## 2020-06-27 HISTORY — DX: Edema, unspecified: R60.9

## 2020-06-27 HISTORY — DX: Sleep apnea, unspecified: G47.30

## 2020-06-27 LAB — BASIC METABOLIC PANEL
Anion gap: 6 (ref 5–15)
BUN: 12 mg/dL (ref 6–20)
CO2: 22 mmol/L (ref 22–32)
Calcium: 8.7 mg/dL — ABNORMAL LOW (ref 8.9–10.3)
Chloride: 107 mmol/L (ref 98–111)
Creatinine, Ser: 1.37 mg/dL — ABNORMAL HIGH (ref 0.44–1.00)
GFR, Estimated: 47 mL/min — ABNORMAL LOW (ref >60)
Glucose, Bld: 93 mg/dL (ref 70–99)
Potassium: 4.2 mmol/L (ref 3.5–5.1)
Sodium: 135 mmol/L (ref 135–145)

## 2020-06-27 LAB — CBC
HCT: 30.4 % — ABNORMAL LOW (ref 36.0–46.0)
Hemoglobin: 9.1 g/dL — ABNORMAL LOW (ref 12.0–15.0)
MCH: 26.8 pg (ref 26.0–34.0)
MCHC: 29.9 g/dL — ABNORMAL LOW (ref 30.0–36.0)
MCV: 89.7 fL (ref 80.0–100.0)
Platelets: 299 10*3/uL (ref 150–400)
RBC: 3.39 MIL/uL — ABNORMAL LOW (ref 3.87–5.11)
RDW: 21.3 % — ABNORMAL HIGH (ref 11.5–15.5)
WBC: 8.8 10*3/uL (ref 4.0–10.5)
nRBC: 0 % (ref 0.0–0.2)

## 2020-06-27 LAB — HEMOGLOBIN A1C
Hgb A1c MFr Bld: 5.4 % (ref 4.8–5.6)
Mean Plasma Glucose: 108.28 mg/dL

## 2020-06-27 NOTE — Progress Notes (Addendum)
Anesthesia Review:  PCP: DR Susa Loffler - [place] [facility] ? 03/2020 Called and requested most recent office visit notes.  [phone]  Cardiologist : Chest x-ray : EKG : 06/01/20 Echo : Stress test: Cardiac Cath :  Activity level:  Sleep Study/ CPAP : Fasting Blood Sugar :      / Checks Blood Sugar -- times a day:   Blood Thinner/ Instructions /Last Dose: ASA / Instructions/ Last Dose :  PT reports at preop recent sinus infection .  Surgery was cancelled 05/2020 due to sinus infection.  Pt states she has completed antibiotics.  Feels better.  Denies any Covid symptoms. Temp 99.8 at preop.  PT states she has been on 4 rounds of antibiotics recently.  Pt reports at preop recent swelling of lower extremities which started on 06/21/20 per pt PT states she reported to urologist.   . HGB 9.1 on CBC results at preop of 06/27/20.  Routed to Dr Benancio Deeds.    A1c-06/27/20-5.4

## 2020-06-29 ENCOUNTER — Other Ambulatory Visit (HOSPITAL_COMMUNITY)
Admission: RE | Admit: 2020-06-29 | Discharge: 2020-06-29 | Disposition: A | Discharge status: Home / Self Care | Payer: BC Managed Care – PPO | Source: Ambulatory Visit | Attending: Urology | Admitting: Urology

## 2020-06-29 DIAGNOSIS — Z01812 Encounter for preprocedural laboratory examination: Secondary | ICD-10-CM | POA: Diagnosis present

## 2020-06-29 DIAGNOSIS — Z20822 Contact with and (suspected) exposure to covid-19: Secondary | ICD-10-CM | POA: Insufficient documentation

## 2020-06-29 LAB — SARS CORONAVIRUS 2 (TAT 6-24 HRS): SARS Coronavirus 2: NEGATIVE

## 2020-07-02 NOTE — Anesthesia Preprocedure Evaluation (Addendum)
Anesthesia Evaluation  Patient identified by MRN, date of birth, ID band Patient awake    Reviewed: Allergy & Precautions, H&P , NPO status , Patient's Chart, lab work & pertinent test results  Airway Mallampati: II  TM Distance: >3 FB Neck ROM: Full    Dental  (+) Lower Dentures, Upper Dentures   Pulmonary sleep apnea and Continuous Positive Airway Pressure Ventilation , former smoker,  OSA resolved with 150 lb wt loss   Pulmonary exam normal        Cardiovascular hypertension, Pt. on medications negative cardio ROS Normal cardiovascular exam     Neuro/Psych Anxiety Depression negative neurological ROS  negative psych ROS   GI/Hepatic negative GI ROS, Neg liver ROS, GERD  Medicated,  Endo/Other  diabetesHypothyroidism Morbid obesityGoiter, no replacement indicated S/p bariatric surgery  Renal/GU Renal diseaseKidney stone  negative genitourinary   Musculoskeletal negative musculoskeletal ROS (+)   Abdominal (+) + obese,   Peds negative pediatric ROS (+)  Hematology  (+) Blood dyscrasia, anemia , Lab Results      Component                Value               Date                      WBC                      8.8                 06/27/2020                HGB                      9.1 (L)             06/27/2020                HCT                      30.4 (L)            06/27/2020                MCV                      89.7                06/27/2020                PLT                      299                 06/27/2020              Anesthesia Other Findings Upper front bonding  Reproductive/Obstetrics negative OB ROS                            Anesthesia Physical  Anesthesia Plan  ASA: III  Anesthesia Plan: General   Post-op Pain Management:    Induction: Intravenous  PONV Risk Score and Plan: 4 or greater and Ondansetron, Dexamethasone and Midazolam  Airway Management Planned:  LMA  Additional Equipment: None  Intra-op Plan:   Post-operative Plan: Extubation in OR  Informed Consent: I have reviewed  the patients History and Physical, chart, labs and discussed the procedure including the risks, benefits and alternatives for the proposed anesthesia with the patient or authorized representative who has indicated his/her understanding and acceptance.     Dental advisory given  Plan Discussed with: CRNA and Anesthesiologist  Anesthesia Plan Comments:        Anesthesia Quick Evaluation

## 2020-07-02 NOTE — H&P (Signed)
Patient is a 50 year old white female whom I saw in consultation for right-sided flank pain and sepsis on 05/17/2020. She is found to have a 7 8 mm right proximal ureteral calculus. Underwent emergent cysto and insertion of right JJ stent on 05/17/2020. She also had acute kidney injury and creatinine was improving at time of discharge home. Urine here for followup for definitive management of her right ureteral calculus. Patient's stone was very difficult to visualize under fluoroscopy due to body habitus. Will need right ureteroscopy and laser lithotripsy.  -06/20/20-patient with history of right proximal ureteral calculus status post cysto and stent on 05/17/2020. Patient subsequently experienced fever postprocedure early and required antibiotic with doxycycline for UTI. Her ureteroscopy and laser lithotripsy was canceled due to the fever. She is here now for follow-up having finished her recent round of doxycycline. The patient also had been treated for sinus infection with Augmentin at urgent care. In the interim patient feeling better has no longer having fever. Does have urgency likely related to the stent.     ALLERGIES: No Allergies    MEDICATIONS: Lisinopril 40 mg tablet  Abilify 10 mg tablet  Alprazolam 0.5 mg tablet  Amlodipine Besylate 10 mg tablet  Citalopram Hbr 20 mg tablet  Iron TABS Oral  Multivitamin  Synthroid 100 mcg tablet     GU PSH: Cysto Uretero Lithotripsy - 2013 Cystoscopy Insert Stent, Right - 05/17/2020, 2013 Cystoscopy Ureteroscopy - 2013 PCNL - 2013       PSH Notes: Cystoscopy With Pyeloscopy With Lithotripsy, Cystoscopy With Insertion Of Ureteral Stent Left, Cesarean Section, Percutaneous Lithotomy, Cystoscopy With Ureteroscopy, High Gastric Bypass   NON-GU PSH: Cesarean Delivery Only - 2013         GU PMH: Ureteral calculus - 05/28/2020 Renal calculus, Kidney stone on left side - 2014      PMH Notes:  1898-03-24 00:00:00 - Note: Normal Routine History And  Physical Adult   NON-GU PMH: Anxiety, Anxiety (Symptom) - 2014 Gastric ulcer, unspecified as acute or chronic, without hemorrhage or perforation, Gastric Ulcer - 2014 Hyperparathyroidism, Hyperparathyroidism - 2014 Personal history of nicotine dependence, Former Smoker - 2014 Personal history of other diseases of the circulatory system, History of hypertension - 2014 Personal history of other diseases of the nervous system and sense organs, History of sleep apnea - 2014 Personal history of other endocrine, nutritional and metabolic disease, History of hypothyroidism - 2014, History of diabetes mellitus, - 2014, History of hypercholesterolemia, - 2014 Personal history of other mental and behavioral disorders, History of depression - 2014 Personal history of other specified conditions, History of heartburn - 2014 Arthritis Depression GERD Hypercholesterolemia Hypertension Sleep Apnea Stroke/TIA    FAMILY HISTORY: nephrolithiasis - Mother Urinary Frequency (Symptom) - Mother urinary tract infection - Mother   SOCIAL HISTORY: Marital Status: Divorced Preferred Language: English; Ethnicity: Not Hispanic Or Latino; Race: White Current Smoking Status: Patient does not smoke anymore.  <DIV'  Tobacco Use Assessment Completed:  Used Tobacco in last 30 days?   Has never drank.  Drinks 4+ caffeinated drinks per day.     Notes: Marital History - Divorced, Caffeine Use, Alcohol Use, Occupation:   REVIEW OF SYSTEMS:     GU Review Female:  Patient reports get up at night to urinate, hard to postpone urination, and frequent urination. Patient denies have to strain to urinate, trouble starting your stream, burning /pain with urination, stream starts and stops, leakage of urine, and being pregnant.    Gastrointestinal (Upper):  Patient denies  nausea, vomiting, and indigestion/ heartburn.    Gastrointestinal (Lower):  Patient denies diarrhea and constipation.    Constitutional:  Patient denies fever,  night sweats, weight loss, and fatigue.    Skin:  Patient denies skin rash/ lesion and itching.    Eyes:  Patient denies blurred vision and double vision.    Ears/ Nose/ Throat:  Patient denies sore throat and sinus problems.    Hematologic/Lymphatic:  Patient denies swollen glands and easy bruising.    Cardiovascular:  Patient denies leg swelling and chest pains.    Respiratory:  Patient denies cough and shortness of breath.    Endocrine:  Patient denies excessive thirst.    Musculoskeletal:  Patient denies back pain and joint pain.    Neurological:  Patient denies headaches and dizziness.    Psychologic:  Patient denies depression and anxiety.    VITAL SIGNS:       06/20/2020 08:58 AM     Weight 240 lb / 108.86 kg     Height 58 in / 147.32 cm     BP 108/67 mmHg     Pulse 96 /min     Temperature 97.5 F / 36.3 C     BMI 50.2 kg/m     MULTI-SYSTEM PHYSICAL EXAMINATION:      Constitutional: Obese. No physical deformities. Normally developed. Good grooming.      Neck: Neck symmetrical, not swollen. Normal tracheal position.     Respiratory: No labored breathing, no use of accessory muscles.      Cardiovascular: Normal temperature, normal extremity pulses, no swelling, no varicosities.     Lymphatic: No enlargement of neck, axillae, groin.     Skin: No paleness, no jaundice, no cyanosis. No lesion, no ulcer, no rash.     Neurologic / Psychiatric: Oriented to time, oriented to place, oriented to person. No depression, no anxiety, no agitation.     Eyes: Normal conjunctivae. Normal eyelids.     Ears, Nose, Mouth, and Throat: Left ear no scars, no lesions, no masses. Right ear no scars, no lesions, no masses. Nose no scars, no lesions, no masses. Normal hearing. Normal lips.     Musculoskeletal: Normal gait and station of head and neck.            Complexity of Data:   Source Of History:  Patient  Records Review:  Previous Doctor Records, Previous Hospital Records, Previous Patient  Records  Urine Test Review:  Urinalysis, Urine Culture   PROCEDURES:    Urinalysis w/Scope  Dipstick Dipstick Cont'd Micro  Color: Yellow Bilirubin: Neg mg/dL WBC/hpf: 20 - 50/DTO  Appearance: Cloudy Ketones: Neg mg/dL RBC/hpf: 20 - 67/TIW  Specific Gravity: 1.025 Blood: 3+ ery/uL Bacteria: Few (10-25/hpf)  pH: 6.5 Protein: Trace mg/dL Cystals: NS (Not Seen)  Glucose: Neg mg/dL Urobilinogen: 0.2 mg/dL Casts: NS (Not Seen)   Nitrites: Neg Trichomonas: Not Present   Leukocyte Esterase: 3+ leu/uL Mucous: Not Present    Epithelial Cells: 0 - 5/hpf    Yeast: NS (Not Seen)    Sperm: Not Present    ASSESSMENT:     ICD-10 Details  1 GU:  Ureteral calculus - N20.1 Acute, Complicated Injury   PLAN:   Document  Letter(s):  Created for Patient: Clinical Summary   Notes:  Plan for cysto, right ureteroscopy with laser lithotripsy and likely stent exchange in the near future. Risks and benefits discussed as outlined below.  I have recommended retrograde pyelogram, ureteroscopic stone manipulation with laser lithotripsy. I  have discussed in detail the risks, benefits and alternatives of ureteroscopic stone extraction to include but not limited to: Bleeding, infection, ureteral perforation with need for open repair, inability to place the stent necessitating the need for further procedures, possible percutaneous nephrostomy tube placement, discomfort from the stents, hematuria, urgency, frequency and refractory problems after the stent is removed. I discussed the stent is not a permanent stent and will require a followup for stent removal or stent exchange. The patient knows there is high risk for ureteral stent incrustation if this is not removed or exchanged within 3 months. Patient voices understanding of the risks and benefits of the procedure and consents to the procedure.

## 2020-07-03 ENCOUNTER — Encounter (HOSPITAL_COMMUNITY)
Admission: RE | Disposition: A | Discharge status: Home / Self Care | Payer: Self-pay | Source: Home / Self Care | Attending: Urology

## 2020-07-03 ENCOUNTER — Ambulatory Visit (HOSPITAL_COMMUNITY): Payer: BC Managed Care – PPO

## 2020-07-03 ENCOUNTER — Ambulatory Visit (HOSPITAL_COMMUNITY): Payer: BC Managed Care – PPO | Admitting: Physician Assistant

## 2020-07-03 ENCOUNTER — Encounter (HOSPITAL_COMMUNITY): Payer: Self-pay | Admitting: Urology

## 2020-07-03 ENCOUNTER — Ambulatory Visit (HOSPITAL_COMMUNITY): Payer: BC Managed Care – PPO | Admitting: Anesthesiology

## 2020-07-03 ENCOUNTER — Ambulatory Visit (HOSPITAL_COMMUNITY)
Admission: RE | Admit: 2020-07-03 | Discharge: 2020-07-03 | Disposition: A | Discharge status: Home / Self Care | Payer: BC Managed Care – PPO | Attending: Urology | Admitting: Urology

## 2020-07-03 DIAGNOSIS — N201 Calculus of ureter: Secondary | ICD-10-CM

## 2020-07-03 DIAGNOSIS — Z87891 Personal history of nicotine dependence: Secondary | ICD-10-CM | POA: Insufficient documentation

## 2020-07-03 DIAGNOSIS — N200 Calculus of kidney: Secondary | ICD-10-CM | POA: Diagnosis present

## 2020-07-03 HISTORY — PX: CYSTOSCOPY/URETEROSCOPY/HOLMIUM LASER/STENT PLACEMENT: SHX6546

## 2020-07-03 LAB — GLUCOSE, CAPILLARY
Glucose-Capillary: 102 mg/dL — ABNORMAL HIGH (ref 70–99)
Glucose-Capillary: 118 mg/dL — ABNORMAL HIGH (ref 70–99)

## 2020-07-03 LAB — PREGNANCY, URINE: Preg Test, Ur: NEGATIVE

## 2020-07-03 SURGERY — CYSTOSCOPY/URETEROSCOPY/HOLMIUM LASER/STENT PLACEMENT
Anesthesia: General | Laterality: Right

## 2020-07-03 MED ORDER — FENTANYL CITRATE (PF) 100 MCG/2ML IJ SOLN
INTRAMUSCULAR | Status: DC | PRN
  Administered 2020-07-03: 25 ug via INTRAVENOUS
  Administered 2020-07-03: 50 ug via INTRAVENOUS
  Administered 2020-07-03: 25 ug via INTRAVENOUS
  Administered 2020-07-03: 50 ug via INTRAVENOUS
  Administered 2020-07-03 (×2): 25 ug via INTRAVENOUS
  Administered 2020-07-03 (×2): 50 ug via INTRAVENOUS

## 2020-07-03 MED ORDER — ORAL CARE MOUTH RINSE: 15.0000 mL | Freq: Once | OROMUCOSAL | Status: AC

## 2020-07-03 MED ORDER — PROPOFOL 10 MG/ML IV BOLUS
INTRAVENOUS | Status: DC | PRN
  Administered 2020-07-03: 50 mg via INTRAVENOUS
  Administered 2020-07-03: 30 mg via INTRAVENOUS
  Administered 2020-07-03: 150 mg via INTRAVENOUS

## 2020-07-03 MED ORDER — CEFAZOLIN SODIUM-DEXTROSE 2-4 GM/100ML-% IV SOLN: 2.0000 g | INTRAVENOUS | Status: DC

## 2020-07-03 MED ORDER — DEXAMETHASONE SODIUM PHOSPHATE 10 MG/ML IJ SOLN
INTRAMUSCULAR | Status: AC
  Filled 2020-07-03: qty 1

## 2020-07-03 MED ORDER — SODIUM CHLORIDE 0.9 % IR SOLN
Status: DC | PRN
  Administered 2020-07-03: 6000 mL

## 2020-07-03 MED ORDER — PROPOFOL 10 MG/ML IV BOLUS
INTRAVENOUS | Status: AC
  Filled 2020-07-03: qty 20

## 2020-07-03 MED ORDER — LIDOCAINE 2% (20 MG/ML) 5 ML SYRINGE
INTRAMUSCULAR | Status: DC | PRN
  Administered 2020-07-03: 100 mg via INTRAVENOUS

## 2020-07-03 MED ORDER — FENTANYL CITRATE (PF) 100 MCG/2ML IJ SOLN
INTRAMUSCULAR | Status: AC
  Filled 2020-07-03: qty 2

## 2020-07-03 MED ORDER — CHLORHEXIDINE GLUCONATE 0.12 % MT SOLN
15.0000 mL | Freq: Once | OROMUCOSAL | Status: AC
  Administered 2020-07-03: 15 mL via OROMUCOSAL

## 2020-07-03 MED ORDER — TRAMADOL HCL 50 MG PO TABS: 50.0000 mg | ORAL_TABLET | Freq: Four times a day (QID) | ORAL | 0 refills | Status: AC | PRN

## 2020-07-03 MED ORDER — LIDOCAINE 2% (20 MG/ML) 5 ML SYRINGE
INTRAMUSCULAR | Status: AC
  Filled 2020-07-03: qty 5

## 2020-07-03 MED ORDER — MIDAZOLAM HCL 5 MG/5ML IJ SOLN
INTRAMUSCULAR | Status: DC | PRN
  Administered 2020-07-03: 2 mg via INTRAVENOUS

## 2020-07-03 MED ORDER — ONDANSETRON HCL 4 MG/2ML IJ SOLN
INTRAMUSCULAR | Status: DC | PRN
  Administered 2020-07-03: 4 mg via INTRAVENOUS

## 2020-07-03 MED ORDER — ONDANSETRON HCL 4 MG/2ML IJ SOLN
INTRAMUSCULAR | Status: AC
  Filled 2020-07-03: qty 2

## 2020-07-03 MED ORDER — DEXAMETHASONE SODIUM PHOSPHATE 10 MG/ML IJ SOLN
INTRAMUSCULAR | Status: DC | PRN
  Administered 2020-07-03: 5 mg via INTRAVENOUS

## 2020-07-03 MED ORDER — IOHEXOL 300 MG/ML  SOLN
INTRAMUSCULAR | Status: DC | PRN
  Administered 2020-07-03: 15 mL

## 2020-07-03 MED ORDER — MIDAZOLAM HCL 2 MG/2ML IJ SOLN
INTRAMUSCULAR | Status: AC
  Filled 2020-07-03: qty 2

## 2020-07-03 MED ORDER — SODIUM CHLORIDE 0.9 % IV SOLN
2.0000 g | INTRAVENOUS | Status: AC
  Administered 2020-07-03: 2 g via INTRAVENOUS
  Filled 2020-07-03: qty 20

## 2020-07-03 MED ORDER — LACTATED RINGERS IV SOLN: INTRAVENOUS | Status: DC

## 2020-07-03 MED ORDER — DOXYCYCLINE HYCLATE 100 MG PO TABS: 100.0000 mg | ORAL_TABLET | Freq: Two times a day (BID) | ORAL | 0 refills | Status: AC

## 2020-07-03 SURGICAL SUPPLY — 25 items
BAG URO CATCHER STRL LF (MISCELLANEOUS) ×2 IMPLANT
BASKET ZERO TIP NITINOL 2.4FR (BASKET) IMPLANT
BSKT STON RTRVL ZERO TP 2.4FR (BASKET)
BULB IRRIG PATHFIND (MISCELLANEOUS) ×2 IMPLANT
CATH URET 5FR 28IN OPEN ENDED (CATHETERS) ×1 IMPLANT
CLOTH BEACON ORANGE TIMEOUT ST (SAFETY) ×2 IMPLANT
EXTRACTOR STONE NITINOL NGAGE (UROLOGICAL SUPPLIES) ×1 IMPLANT
GLOVE SURG ENC TEXT LTX SZ7.5 (GLOVE) ×2 IMPLANT
GOWN STRL REUS W/TWL XL LVL3 (GOWN DISPOSABLE) ×2 IMPLANT
GUIDEWIRE ANG ZIPWIRE 038X150 (WIRE) IMPLANT
GUIDEWIRE STR DUAL SENSOR (WIRE) ×2 IMPLANT
IV NS 1000ML (IV SOLUTION) ×2
IV NS 1000ML BAXH (IV SOLUTION) ×1 IMPLANT
KIT TURNOVER KIT A (KITS) ×2 IMPLANT
LASER FIB FLEXIVA PULSE ID 365 (Laser) IMPLANT
MANIFOLD NEPTUNE II (INSTRUMENTS) ×2 IMPLANT
PACK CYSTO (CUSTOM PROCEDURE TRAY) ×2 IMPLANT
SHEATH URETERAL 12FRX28CM (UROLOGICAL SUPPLIES) ×1 IMPLANT
SHEATH URETERAL 12FRX35CM (MISCELLANEOUS) IMPLANT
STENT URET 6FRX22 CONTOUR (STENTS) ×1 IMPLANT
SYR 20ML LL LF (SYRINGE) ×2 IMPLANT
TRACTIP FLEXIVA PULS ID 200XHI (Laser) IMPLANT
TRACTIP FLEXIVA PULSE ID 200 (Laser) ×2
TUBING CONNECTING 10 (TUBING) ×2 IMPLANT
TUBING UROLOGY SET (TUBING) ×2 IMPLANT

## 2020-07-03 NOTE — Op Note (Signed)
Preoperative diagnosis:  1.  Right renal calculus  Postoperative diagnosis: 1.  Same  Procedure(s): 1.  Cystoscopy, right retrograde pyelogram with intraoperative interpretation, right ureteroscopy and pyeloscopy with laser lithotripsy of right renal calculus, stone extraction, right JJ stent exchange  Surgeon: Dr. Karoline Caldwell  Anesthesia: General  Complications: None  EBL: Minimal  Specimens: Kidney stone  Disposition of specimens: With the patient  Intraoperative findings: Approximate 1 cm right middle calyceal stone, stone completely fragmented and significant fragments extracted.  6 [place] by 22 cm Percuflex plus soft Contour stent exchanged  Indication: Patient is a 50 year old white female who presented into February 2022 with urosepsis and obstructing right ureteral calculus.  Underwent cystoscopy and urgent placement of right JJ stent at that time.  She has been treated with antibiotic therapy and now presents at this time undergo cystoscopy and right ureteroscopy and laser lithotripsy of now right renal calculus.  Description of procedure:  After obtaining for consent for the patient she was taken the major cystoscopy suite placed under general anesthesia.  She is placed in the dorsolithotomy position genitalia prepped and draped in usual sterile fashion.  Proper pause and timeout was performed for site of procedure.  21 Fransico was advanced into the bladder and right JJ stent was identified.  Utilizing alligator graspers stent was removed.  5 French open tip catheter was then utilized perform retrograde pyelogram on the right side which revealed filling defect within the right middle calyceal area consistent with a stone previously mentioned in HPI.  A sensor wire was subsequently passed up the renal pelvis and coiled.  The operative catheter was removed as was the cystoscope leaving the guidewire in place.  The 12 French 3025 cm ureteral access sheath was subsequently  passed over the guidewire with minimal resistance along the length of the ureter just beneath the ureteropelvic junction on the right.  The stylette was removed.  The digital flexible scope was advanced through the access sheath and easily advanced into the right renal pelvis.  There is mild narrowing at the UPJ area.  Stone was identified in the middle calyx area.  Utilizing the 200 m laser fiber the stone was fragmented into numerous small pieces.  Engage basket was then utilized to extract all of the significant sized fragments with individual passes of the ureteroscope.  Final look at the kidney revealed no significant fragments remaining and just dust particles.  Retrograde pyelogram through the ureteroscope revealed good integrity of the collecting system without evidence of extravasation or other filling defects.  Guidewire was passed up to the renal pelvis.  The sheath access sheath and flexible scope were then backed out of the ureter visualizing ureter along its length with no fragments or lesions seen.  The wire was then back fed through the cystoscope and a 6 [place] by 22 cm Percuflex plus soft Contour stent was placed leaving a proximal coil in the renal pelvis and a distal coil in the bladder.  There was good flow of urine through and around the stent noted.  Bladder was emptied procedure terminated.  She was awakened from anesthesia and taken back to the recovery room in stable condition.  No immediate complication from the procedure.

## 2020-07-03 NOTE — Anesthesia Procedure Notes (Signed)
Procedure Name: LMA Insertion Date/Time: 07/03/2020 12:21 PM Performed by: Elyn Peers, CRNA Pre-anesthesia Checklist: Patient identified, Emergency Drugs available, Suction available, Patient being monitored and Timeout performed Patient Re-evaluated:Patient Re-evaluated prior to induction Oxygen Delivery Method: Circle system utilized Preoxygenation: Pre-oxygenation with 100% oxygen Induction Type: IV induction Ventilation: Mask ventilation without difficulty LMA: LMA inserted and LMA with gastric port inserted LMA Size: 4.0 Number of attempts: 1 Placement Confirmation: positive ETCO2 and breath sounds checked- equal and bilateral Tube secured with: Tape Dental Injury: Teeth and Oropharynx as per pre-operative assessment

## 2020-07-03 NOTE — Interval H&P Note (Signed)
History and Physical Interval Note:  07/03/2020 10:07 AM  Tonya Cameron  has presented today for surgery, with the diagnosis of RIGHT URETERAL CALCULUS.  The various methods of treatment have been discussed with the patient and family. After consideration of risks, benefits and other options for treatment, the patient has consented to  Procedure(s) with comments: CYSTOSCOPY/URETEROSCOPY/HOLMIUM LASER/STENT PLACEMENT (Right) - 1 HR as a surgical intervention.  The patient's history has been reviewed, patient examined, no change in status, stable for surgery.  I have reviewed the patient's chart and labs.  Questions were answered to the patient's satisfaction.     [name]

## 2020-07-03 NOTE — Transfer of Care (Signed)
Immediate Anesthesia Transfer of Care Note  Patient: Tonya Cameron  Procedure(s) Performed: CYSTOSCOPY/URETEROSCOPY/HOLMIUM LASER/STENT PLACEMENT (Right )  Patient Location: PACU  Anesthesia Type:General  Level of Consciousness: awake, sedated and responds to stimulation  Airway & Oxygen Therapy: Patient Spontanous Breathing and Patient connected to face mask oxygen  Post-op Assessment: Report given to RN and Post -op Vital signs reviewed and stable  Post vital signs: Reviewed and stable  Last Vitals:  Vitals Value Taken Time  BP 107/74 07/03/20 1415  Temp    Pulse 95 07/03/20 1416  Resp 19 07/03/20 1416  SpO2 98 % 07/03/20 1416  Vitals shown include unvalidated device data.  Last Pain:  Vitals:   07/03/20 1021  TempSrc: Oral  PainSc: 3       Patients Stated Pain Goal: 3 (07/03/20 1021)  Complications: No complications documented.

## 2020-07-04 ENCOUNTER — Encounter (HOSPITAL_COMMUNITY): Payer: Self-pay | Admitting: Urology

## 2020-07-04 NOTE — Anesthesia Postprocedure Evaluation (Signed)
Anesthesia Post Note  Patient: KRIS BURD  Procedure(s) Performed: CYSTOSCOPY/URETEROSCOPY/HOLMIUM LASER/STENT PLACEMENT (Right )     Patient location during evaluation: PACU Anesthesia Type: General Level of consciousness: awake and alert Pain management: pain level controlled Vital Signs Assessment: post-procedure vital signs reviewed and stable Respiratory status: spontaneous breathing, nonlabored ventilation, respiratory function stable and patient connected to nasal cannula oxygen Cardiovascular status: blood pressure returned to baseline and stable Postop Assessment: no apparent nausea or vomiting Anesthetic complications: no   No complications documented.  Last Vitals:  Vitals:   07/03/20 1445 07/03/20 1500  BP: 116/72 126/78  Pulse: (!) 104 98  Resp: (!) 21 20  Temp:  37.5 C  SpO2: 96% 97%    Last Pain:  Vitals:   07/03/20 1500  TempSrc:   PainSc: 0-No pain                 Kennieth Rad

## 2021-11-11 IMAGING — CT CT RENAL STONE PROTOCOL
2 of 4 series · 16 of 46 positions shown, 18 images · non-contrast
Comparison: 08/28/2011 from [HOSPITAL]

CLINICAL DATA: Bilateral flank pain.  History kidney stones.

EXAM:
CT ABDOMEN AND PELVIS WITHOUT CONTRAST
TECHNIQUE: Multidetector CT imaging of the abdomen and pelvis was performed
following the standard protocol without IV contrast.

[Series 2: axial st · axial · 0.93mm/px · z∈[+1136,+1546]mm · 13 of 94 slices shown, 15 images]
[im 6/94  soft-tissue]
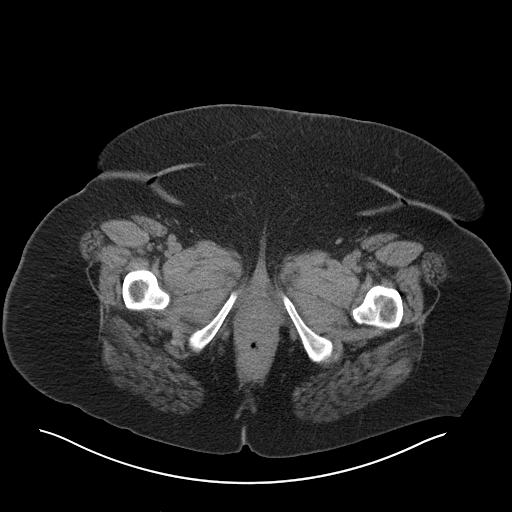
[im 6/94  bone]
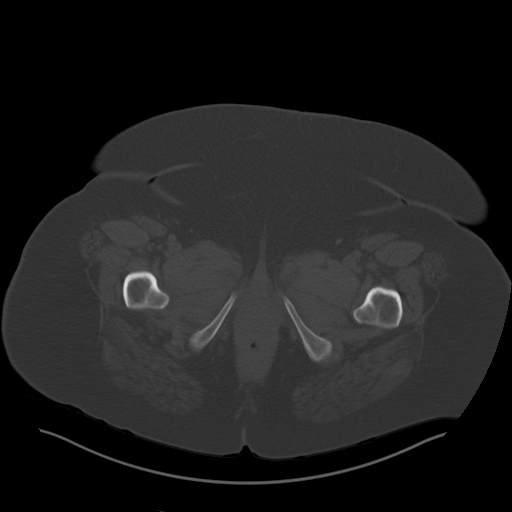
[im 11/94  soft-tissue]
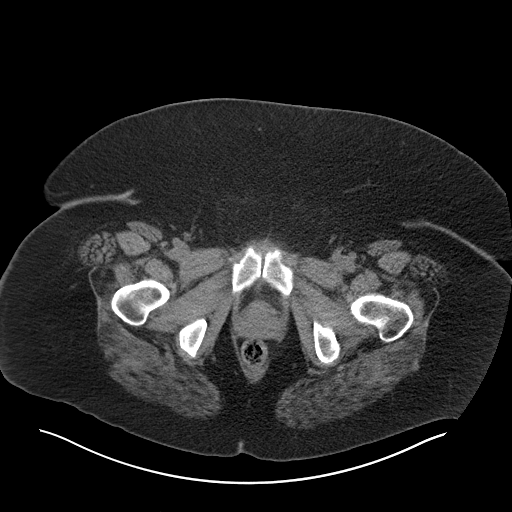
[im 22/94  soft-tissue]
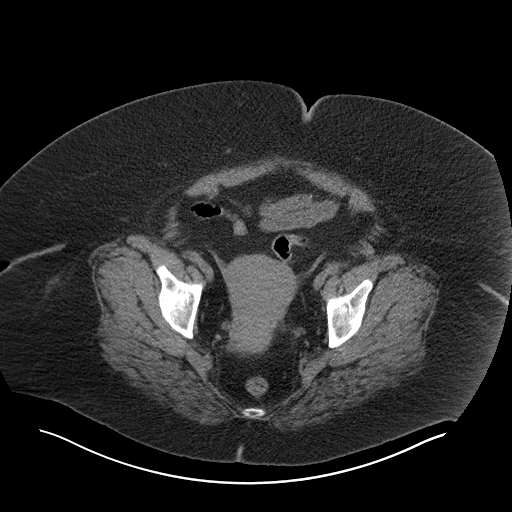
[im 28/94  soft-tissue]
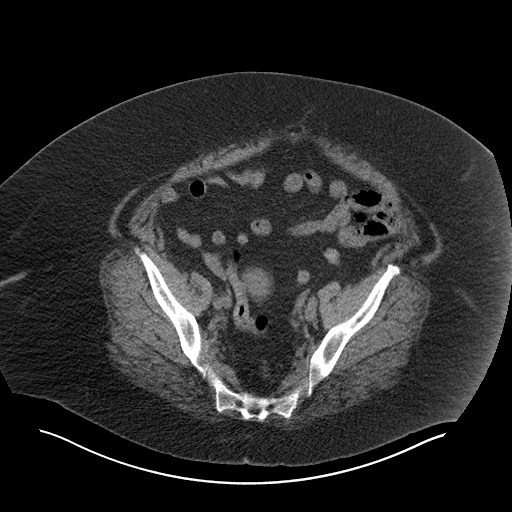
[im 33/94  soft-tissue]
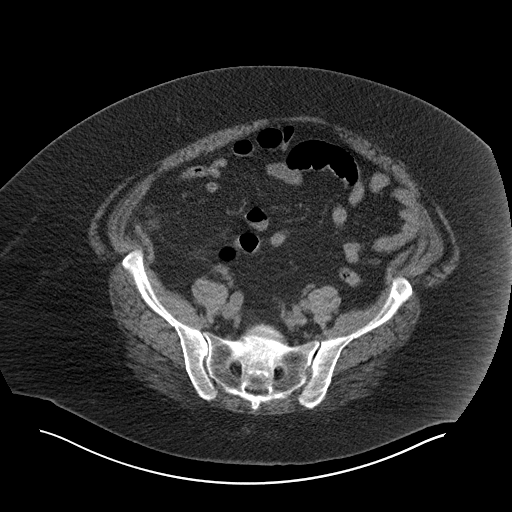
[im 39/94  soft-tissue]
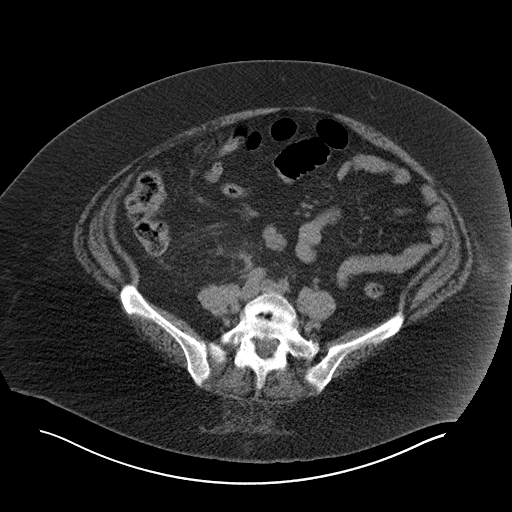
[im 50/94  soft-tissue]
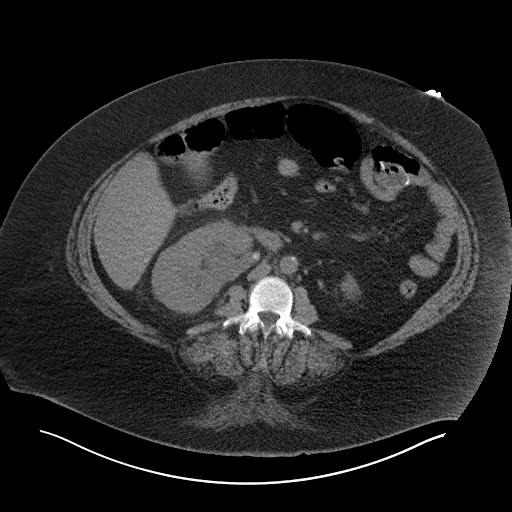
[im 55/94  soft-tissue]
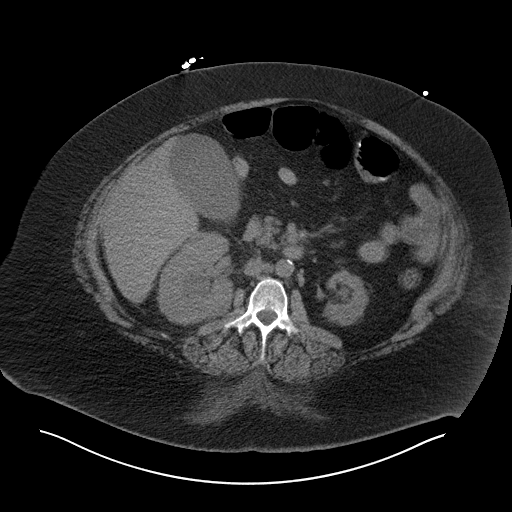
[im 61/94  soft-tissue]
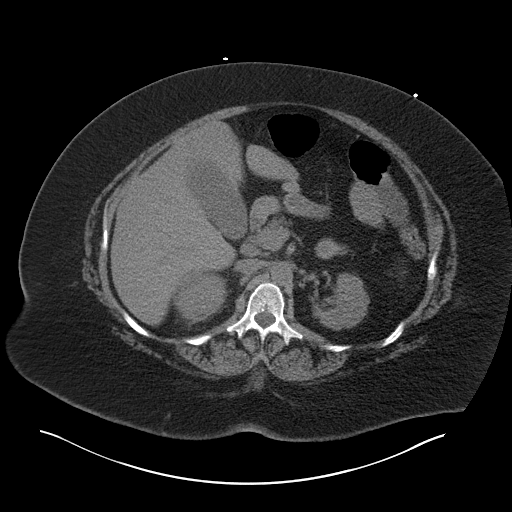
[im 61/94  bone]
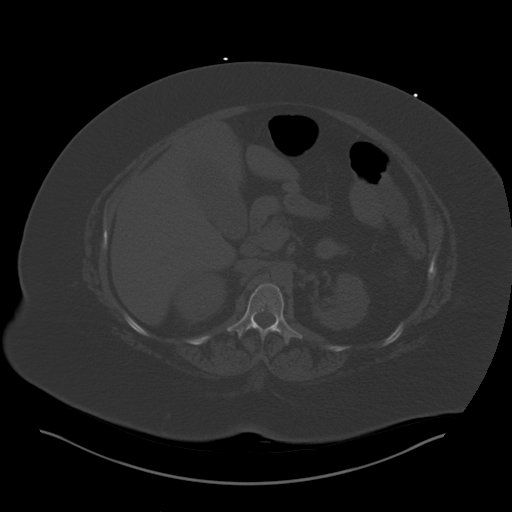
[im 66/94  soft-tissue]
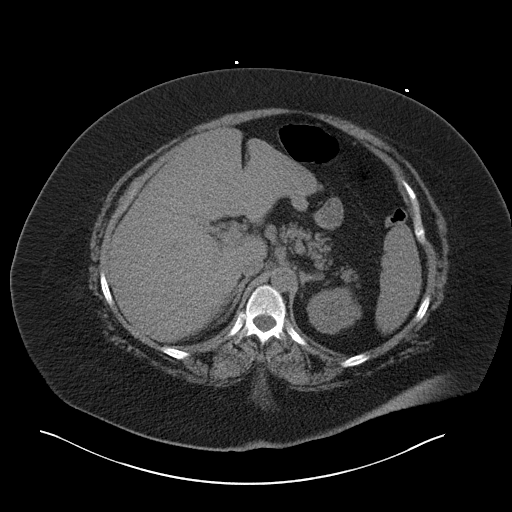
[im 72/94  soft-tissue]
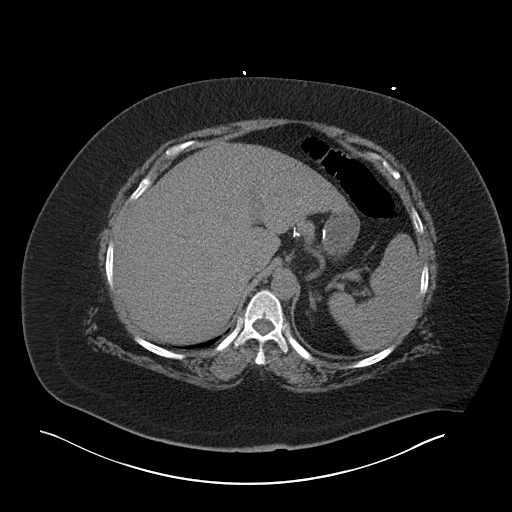
[im 83/94  soft-tissue]
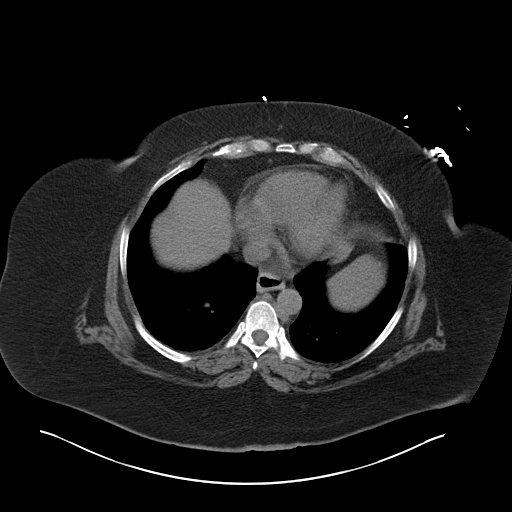
[im 88/94  soft-tissue]
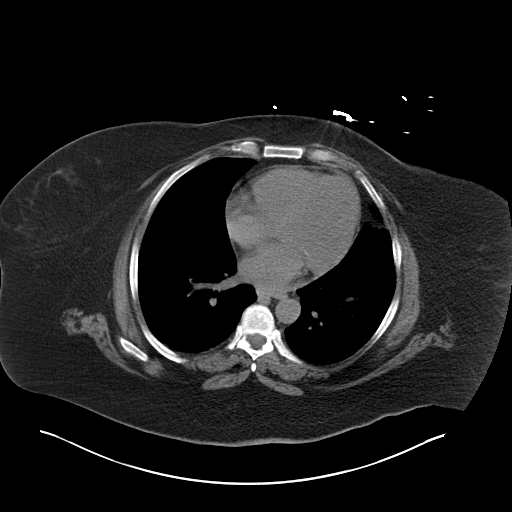

[Series 4: coronal · coronal · 0.90mm/px · 3 of 192 slices shown]
[im 64/192  soft-tissue]
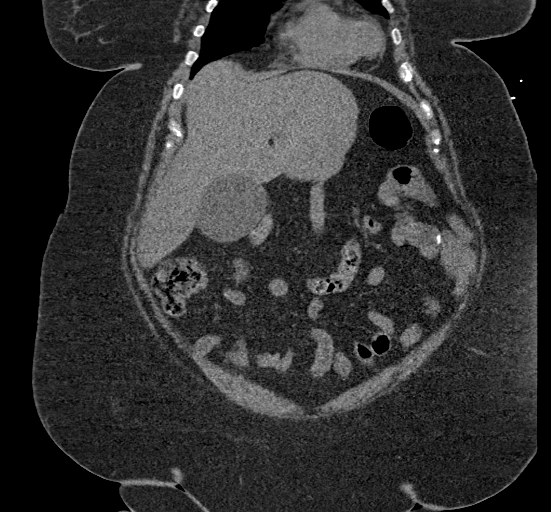
[im 85/192  soft-tissue]
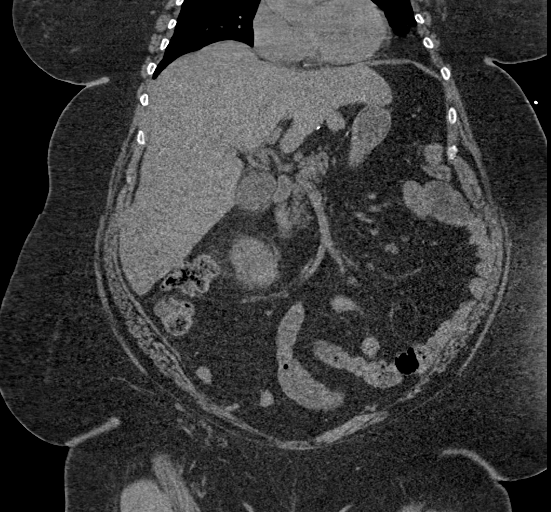
[im 107/192  soft-tissue]
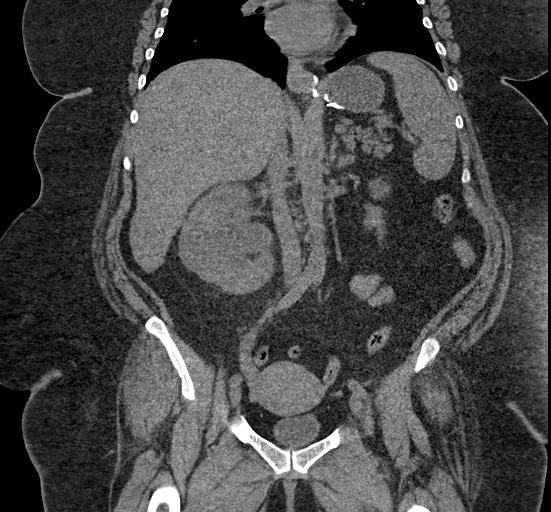

[16 of 46 positions shown; findings below may reference images not displayed]

FINDINGS: Lower chest: Clear lung bases. Normal heart size without pericardial
or pleural effusion. Suspect lad or distal left main coronary artery
calcification on [DATE]. Dilated distal esophagus with fluid level
within on [DATE].

Hepatobiliary: Hepatomegaly at 23.4 cm craniocaudal.

No gallstones. Gallbladder distension. Equivocal pericholecystic
edema including on 38/2. No biliary duct dilatation.

Pancreas: Normal, without mass or ductal dilatation.

Spleen: Normal in size, without focal abnormality.

Adrenals/Urinary Tract: Normal adrenal glands. Mild lleft renal
cortical thinning. Mild right renal and perirenal edema with
hydronephrosis secondary to a stone of 9 mm at the ureteropelvic
junction on 46/2. No bladder calculi.

Stomach/Bowel: Surgical changes of presumably Roux-en-Y gastric
bypass. Normal colon and terminal ileum. Otherwise normal small
bowel.

Vascular/Lymphatic: Aortic atherosclerosis. No abdominopelvic
adenopathy.

Reproductive: Normal uterus and adnexa.

Other: No significant free fluid.

Musculoskeletal: No acute osseous abnormality. Lower lumbar
spondylosis.
IMPRESSION: 1. Mild right-sided urinary tract obstruction secondary to a 9 mm
stone at the ureteropelvic junction.
2. Hepatomegaly.
3. Esophageal air fluid level suggests dysmotility or
gastroesophageal reflux.
4. Gallbladder distension with equivocal pericholecystic edema.
Correlate with right upper quadrant symptoms and consider
ultrasound.
5. Suspicion of markedly advanced coronary artery atherosclerosis.
Recommend assessment of coronary risk factors.
6. Aortic Atherosclerosis ([id number]).

## 2022-01-15 ENCOUNTER — Emergency Department (HOSPITAL_BASED_OUTPATIENT_CLINIC_OR_DEPARTMENT_OTHER)
Admission: EM | Admit: 2022-01-15 | Discharge: 2022-01-15 | Disposition: A | Discharge status: Home / Self Care | Payer: BC Managed Care – PPO | Attending: Emergency Medicine | Admitting: Emergency Medicine

## 2022-01-15 ENCOUNTER — Encounter (HOSPITAL_BASED_OUTPATIENT_CLINIC_OR_DEPARTMENT_OTHER): Payer: Self-pay

## 2022-01-15 ENCOUNTER — Other Ambulatory Visit: Payer: Self-pay

## 2022-01-15 ENCOUNTER — Emergency Department (HOSPITAL_BASED_OUTPATIENT_CLINIC_OR_DEPARTMENT_OTHER): Payer: BC Managed Care – PPO | Admitting: Radiology

## 2022-01-15 DIAGNOSIS — M79672 Pain in left foot: Secondary | ICD-10-CM | POA: Diagnosis present

## 2022-01-15 DIAGNOSIS — S93602A Unspecified sprain of left foot, initial encounter: Secondary | ICD-10-CM | POA: Insufficient documentation

## 2022-01-15 DIAGNOSIS — W1849XA Other slipping, tripping and stumbling without falling, initial encounter: Secondary | ICD-10-CM | POA: Insufficient documentation

## 2022-01-15 NOTE — ED Provider Notes (Signed)
[facility] DEPT Provider Note   CSN: [id number] Arrival date & time: 01/15/22  1700     History  Chief Complaint  Patient presents with   Foot Pain    Tonya Cameron is a 51 y.o. female.  HPI 51 year old female presents with left lateral foot pain.  Is also plantar.  Started a week and a half ago after stepping over a box and then feels like she heard a pop when her foot slipped.  It seems to be worsening and pain and difficulty walking.  No significant swelling.  No ankle pain.  No weakness or numbness.  Has been taking ibuprofen and Tylenol.  Home Medications Prior to Admission medications   Medication Sig Start Date End Date Taking? Authorizing Provider  acetaminophen (TYLENOL) 500 MG tablet Take 1,000 mg by mouth every 6 (six) hours as needed for moderate pain.    [provider]  ALPRAZolam Duanne Moron) 0.5 MG tablet Take 0.25 mg by mouth in the morning. 04/13/20   [provider]  amLODipine (NORVASC) 10 MG tablet Take 10 mg by mouth daily. 12/02/19   [provider]  amphetamine-dextroamphetamine (ADDERALL) 30 MG tablet Take 30 mg by mouth 2 (two) times daily. 04/18/20   [provider]  ARIPiprazole (ABILIFY) 5 MG tablet Take 5 mg by mouth at bedtime. 04/12/20   [provider]  cetirizine (ZYRTEC) 10 MG tablet Take 10 mg by mouth daily.    [provider]  citalopram (CELEXA) 40 MG tablet Take 40 mg by mouth at bedtime.    [provider]  EUTHYROX 100 MCG tablet Take 100 mcg by mouth daily with lunch. 11/21/19   [provider]  ferrous sulfate 325 (65 FE) MG EC tablet Place 325 mg under the tongue 2 (two) times daily with a meal.    [provider]  fluticasone (FLONASE) 50 MCG/ACT nasal spray Place into both nostrils daily.    [provider]  guaiFENesin (MUCINEX) 600 MG 12 hr tablet Take by mouth 2 (two) times daily as needed.    [provider]  Multiple  Vitamins-Iron (MULTIVITAMIN/IRON PO) Take 1 tablet by mouth daily.    [provider]  omeprazole (PRILOSEC) 20 MG capsule Take 20 mg by mouth at bedtime.    [provider]  traMADol (ULTRAM) 50 MG tablet Take 1 tablet (50 mg total) by mouth every 6 (six) hours as needed for moderate pain. 05/19/20   Donne Hazel, MD  triamcinolone (NASACORT) 55 MCG/ACT AERO nasal inhaler Place 2 sprays into the nose 2 (two) times daily.    [provider]      Allergies    Clindamycin/lincomycin and Zoloft [sertraline hcl]    Review of Systems   Review of Systems  Musculoskeletal:  Positive for arthralgias. Negative for joint swelling.  Neurological:  Negative for weakness and numbness.    Physical Exam Updated Vital Signs BP (!) 148/103   Pulse 68   Temp 97.7 F (36.5 C) (Temporal)   Resp 18   Ht 4\' 10"  (1.473 m)   Wt 123.8 kg   SpO2 100%   BMI 57.06 kg/m  Physical Exam Vitals and nursing note reviewed.  Constitutional:      Appearance: She is well-developed.  HENT:     Head: Normocephalic and atraumatic.  Cardiovascular:     Rate and Rhythm: Normal rate and regular rhythm.     Pulses:  Dorsalis pedis pulses are 2+ on the left side.  Pulmonary:     Effort: Pulmonary effort is normal.  Musculoskeletal:     Left ankle: No tenderness. Normal range of motion.     Left foot: Tenderness present. No swelling.       Feet:  Skin:    General: Skin is warm and dry.  Neurological:     Mental Status: She is alert.     ED Results / Procedures / Treatments   Labs (all labs ordered are listed, but only abnormal results are displayed) Labs Reviewed - No data to display  EKG None  Radiology DG Foot Complete Left  Result Date: 01/15/2022 CLINICAL DATA:  Left foot injury 1.5 weeks ago with progressive foot pain. EXAM: LEFT FOOT - COMPLETE 3+ VIEW COMPARISON:  None Available. FINDINGS: The bones are demineralized. There is no evidence of acute fracture  or dislocation. The joint spaces are preserved. No focal soft tissue swelling or foreign body identified. IMPRESSION: No evidence of acute fracture or dislocation. Osteopenia. Electronically Signed   By: Carey Bullocks M.D.   On: 01/15/2022 18:21    Procedures Procedures    Medications Ordered in ED Medications - No data to display  ED Course/ Medical Decision Making/ A&P                           Medical Decision Making Amount and/or Complexity of Data Reviewed Radiology: ordered.   I personally viewed/interpreted the x-ray images.  No fractures or dislocation.  Does not seem like this is a plantar fasciitis based on exam.  It is more lateral in the area of her discomfort though no significant swelling.  Neurovascular intact.  We will give a post op shoe and have her follow up with PCP if not improving. Doubt occult fracture.        Final Clinical Impression(s) / ED Diagnoses Final diagnoses:  Foot sprain, left, initial encounter    Rx / DC Orders ED Discharge Orders     None         Pricilla Loveless, MD 01/15/22 2112

## 2022-01-15 NOTE — ED Triage Notes (Signed)
Patient here POV from Home.  Endorses Left Foot Pain after Over-Stepping approximately 1.5 Weeks. Worsened since. No Ankle Pain.   NAD Noted during Triage. A&Ox4. GCS 15. Ambulatory but Painful.

## 2022-08-07 DIAGNOSIS — E785 Hyperlipidemia, unspecified: Secondary | ICD-10-CM | POA: Diagnosis not present

## 2022-08-07 DIAGNOSIS — Z09 Encounter for follow-up examination after completed treatment for conditions other than malignant neoplasm: Secondary | ICD-10-CM | POA: Diagnosis not present

## 2022-08-07 DIAGNOSIS — E039 Hypothyroidism, unspecified: Secondary | ICD-10-CM | POA: Diagnosis not present

## 2022-08-07 DIAGNOSIS — R5383 Other fatigue: Secondary | ICD-10-CM | POA: Diagnosis not present

## 2022-08-07 DIAGNOSIS — E559 Vitamin D deficiency, unspecified: Secondary | ICD-10-CM | POA: Diagnosis not present

## 2022-08-07 DIAGNOSIS — G9331 Postviral fatigue syndrome: Secondary | ICD-10-CM | POA: Diagnosis not present

## 2022-08-13 DIAGNOSIS — F411 Generalized anxiety disorder: Secondary | ICD-10-CM | POA: Diagnosis not present

## 2022-08-13 DIAGNOSIS — G4700 Insomnia, unspecified: Secondary | ICD-10-CM | POA: Diagnosis not present

## 2022-08-13 DIAGNOSIS — F3342 Major depressive disorder, recurrent, in full remission: Secondary | ICD-10-CM | POA: Diagnosis not present

## 2022-08-13 DIAGNOSIS — N951 Menopausal and female climacteric states: Secondary | ICD-10-CM | POA: Diagnosis not present

## 2022-08-13 DIAGNOSIS — F900 Attention-deficit hyperactivity disorder, predominantly inattentive type: Secondary | ICD-10-CM | POA: Diagnosis not present

## 2022-08-13 DIAGNOSIS — G4733 Obstructive sleep apnea (adult) (pediatric): Secondary | ICD-10-CM | POA: Diagnosis not present

## 2022-08-13 DIAGNOSIS — E039 Hypothyroidism, unspecified: Secondary | ICD-10-CM | POA: Diagnosis not present

## 2022-11-27 DIAGNOSIS — I10 Essential (primary) hypertension: Secondary | ICD-10-CM | POA: Diagnosis not present

## 2022-11-27 DIAGNOSIS — N200 Calculus of kidney: Secondary | ICD-10-CM | POA: Diagnosis not present

## 2022-11-27 DIAGNOSIS — Z7189 Other specified counseling: Secondary | ICD-10-CM | POA: Diagnosis not present

## 2022-11-27 DIAGNOSIS — E1169 Type 2 diabetes mellitus with other specified complication: Secondary | ICD-10-CM | POA: Diagnosis not present

## 2022-11-27 DIAGNOSIS — N390 Urinary tract infection, site not specified: Secondary | ICD-10-CM | POA: Diagnosis not present

## 2022-11-27 DIAGNOSIS — R351 Nocturia: Secondary | ICD-10-CM | POA: Diagnosis not present

## 2022-11-27 DIAGNOSIS — F321 Major depressive disorder, single episode, moderate: Secondary | ICD-10-CM | POA: Diagnosis not present

## 2022-11-27 DIAGNOSIS — K219 Gastro-esophageal reflux disease without esophagitis: Secondary | ICD-10-CM | POA: Diagnosis not present

## 2022-11-27 DIAGNOSIS — Z23 Encounter for immunization: Secondary | ICD-10-CM | POA: Diagnosis not present

## 2022-12-23 DIAGNOSIS — W19XXXA Unspecified fall, initial encounter: Secondary | ICD-10-CM | POA: Diagnosis not present

## 2022-12-23 DIAGNOSIS — M79661 Pain in right lower leg: Secondary | ICD-10-CM | POA: Diagnosis not present

## 2022-12-23 DIAGNOSIS — R079 Chest pain, unspecified: Secondary | ICD-10-CM | POA: Diagnosis not present

## 2022-12-23 DIAGNOSIS — R300 Dysuria: Secondary | ICD-10-CM | POA: Diagnosis not present

## 2022-12-23 DIAGNOSIS — W1830XA Fall on same level, unspecified, initial encounter: Secondary | ICD-10-CM | POA: Diagnosis not present

## 2022-12-23 DIAGNOSIS — M79651 Pain in right thigh: Secondary | ICD-10-CM | POA: Diagnosis not present

## 2022-12-23 DIAGNOSIS — N3000 Acute cystitis without hematuria: Secondary | ICD-10-CM | POA: Diagnosis not present

## 2022-12-29 DIAGNOSIS — R0789 Other chest pain: Secondary | ICD-10-CM | POA: Diagnosis not present

## 2022-12-29 DIAGNOSIS — S20219A Contusion of unspecified front wall of thorax, initial encounter: Secondary | ICD-10-CM | POA: Diagnosis not present

## 2022-12-29 DIAGNOSIS — W1830XD Fall on same level, unspecified, subsequent encounter: Secondary | ICD-10-CM | POA: Diagnosis not present

## 2022-12-29 DIAGNOSIS — S7011XD Contusion of right thigh, subsequent encounter: Secondary | ICD-10-CM | POA: Diagnosis not present

## 2022-12-29 DIAGNOSIS — S20211D Contusion of right front wall of thorax, subsequent encounter: Secondary | ICD-10-CM | POA: Diagnosis not present

## 2023-02-05 DIAGNOSIS — F900 Attention-deficit hyperactivity disorder, predominantly inattentive type: Secondary | ICD-10-CM | POA: Diagnosis not present

## 2023-02-05 DIAGNOSIS — F331 Major depressive disorder, recurrent, moderate: Secondary | ICD-10-CM | POA: Diagnosis not present

## 2023-02-05 DIAGNOSIS — F411 Generalized anxiety disorder: Secondary | ICD-10-CM | POA: Diagnosis not present

## 2023-02-05 DIAGNOSIS — F4321 Adjustment disorder with depressed mood: Secondary | ICD-10-CM | POA: Diagnosis not present

## 2023-04-03 DIAGNOSIS — F3342 Major depressive disorder, recurrent, in full remission: Secondary | ICD-10-CM | POA: Diagnosis not present

## 2023-04-03 DIAGNOSIS — F411 Generalized anxiety disorder: Secondary | ICD-10-CM | POA: Diagnosis not present

## 2023-10-25 ENCOUNTER — Ambulatory Visit (INDEPENDENT_AMBULATORY_CARE_PROVIDER_SITE_OTHER)

## 2023-10-25 ENCOUNTER — Ambulatory Visit: Admission: EM | Admit: 2023-10-25 | Discharge: 2023-10-25 | Disposition: A | Discharge status: Home / Self Care

## 2023-10-25 DIAGNOSIS — S92325A Nondisplaced fracture of second metatarsal bone, left foot, initial encounter for closed fracture: Secondary | ICD-10-CM

## 2023-10-25 DIAGNOSIS — S92325S Nondisplaced fracture of second metatarsal bone, left foot, sequela: Secondary | ICD-10-CM

## 2023-10-25 NOTE — ED Triage Notes (Addendum)
 About 10 yrs ago I dropped a fridge on my left foot (Fracture at that time) and since then something is recurrent (pain, swelling and decreased ability to move), I did something weird again and it popped popped out of alignment again with pain/swelling.

## 2023-10-25 NOTE — Discharge Instructions (Signed)
  Please follow up with ortho as soon as possible.  Wear boot or post op shoe anytime you are weight bearing in the meantime.

## 2023-10-28 ENCOUNTER — Encounter: Payer: Self-pay | Admitting: Physician Assistant

## 2023-10-28 NOTE — ED Provider Notes (Signed)
 [facility]    CSN: [id number] Arrival date & time: 10/25/23  1358      History   Chief Complaint Chief Complaint  Patient presents with   Foot Injury    HPI Tonya Cameron is a 53 y.o. female.   Patient here today for evaluation of left foot pain.  She reports that she dropped a fridge on her foot about 10 years ago and sustained a fracture and every now and again she will have something feel as if it pops out of place.  She reports that typically she can pop this back into place in her foot however recently she has had some issues with this and continues to have some pain and swelling.  The history is provided by the patient.  Foot Injury Associated symptoms: no fever     Past Medical History:  Diagnosis Date   Anxiety    Calculus of left kidney    Depression    Edema    swelling in legs started on 06/21/20    Frequency of urination    GERD (gastroesophageal reflux disease)    Hematuria    History of diabetes mellitus PT STATES NO PROBLEMS SINCE 2009 GASTRIC BYPASS   History of kidney stones    History of sleep apnea OSA W/ CPAP--  NO ISSUES SINCE 2009 GASTRIC BYPASS   Hypertension    Hypothyroid    Iron deficiency anemia    Pneumonia    hx of as a baby    Pre-diabetes    Recurrent sinus infections    S/P gastric bypass    Seasonal allergies    Sleep apnea    cpap    Thyroid  disease    Urgency of urination    Vitamin D deficiency     Patient Active Problem List   Diagnosis Date Noted   GERD (gastroesophageal reflux disease) 05/17/2020   Essential hypertension 05/17/2020   Diabetes (HCC) 05/17/2020   Nephrolithiasis 05/17/2020   Renal calculus, right 10/17/2011    Past Surgical History:  Procedure Laterality Date   CESAREAN SECTION  04-20-1997   CYSTOSCOPY W/ URETERAL STENT PLACEMENT  10/17/2011   Procedure: CYSTOSCOPY WITH RETROGRADE PYELOGRAM/URETERAL STENT PLACEMENT;  Surgeon: Mark C Ottelin, MD;  Location: [facility];   Service: Urology;  Laterality: Left;   CYSTOSCOPY W/ URETERAL STENT PLACEMENT Right 05/17/2020   Procedure: CYSTOSCOPY WITH RETROGRADE PYELOGRAM/URETERAL STENT PLACEMENT;  Surgeon: Rosalind Zachary NOVAK, MD;  Location: [facility];  Service: Urology;  Laterality: Right;   CYSTOSCOPY/URETEROSCOPY/HOLMIUM LASER/STENT PLACEMENT Right 07/03/2020   Procedure: CYSTOSCOPY/URETEROSCOPY/HOLMIUM LASER/STENT PLACEMENT;  Surgeon: Rosalind Zachary NOVAK, MD;  Location: [facility];  Service: Urology;  Laterality: Right;  1 HR   LAPAROSCOPIC GASTRIC BYPASS  03-20-2008   MULTIPLE TOOTH EXTRACTIONS     PERCUTANEOUS NEPHROSTOLITHOTOMY  JAN 2012 (WAKE MED)   LEFT SIDE STONE EXTRACTION   RIGHT URETEROSCOPIC STONE EXTRACTION  JAN 2012    OB History     Gravida  1   Para      Term      Preterm      AB      Living         SAB      IAB      Ectopic      Multiple      Live Births               Home Medications    Prior to Admission medications  Medication Sig Start Date End Date Taking? Authorizing Provider  amLODipine (NORVASC) 10 MG tablet Take 10 mg by mouth daily. 12/02/19  Yes [provider]  amphetamine -dextroamphetamine  (ADDERALL) 30 MG tablet Take 30 mg by mouth 2 (two) times daily. 04/18/20  Yes [provider]  ARIPiprazole  (ABILIFY ) 5 MG tablet Take 5 mg by mouth at bedtime. 04/12/20  Yes [provider]  azithromycin (ZITHROMAX) 250 MG tablet Take by mouth. 06/11/23  Yes [provider]  cetirizine (ZYRTEC) 10 MG tablet Take 10 mg by mouth daily.   Yes [provider]  citalopram  (CELEXA ) 40 MG tablet Take 40 mg by mouth at bedtime.   Yes [provider]  desvenlafaxine (PRISTIQ) 50 MG 24 hr tablet Take 50 mg by mouth every morning. 10/08/23  Yes [provider]  diphenhydrAMINE (BENADRYL) 25 mg capsule Take 25 mg by mouth every 6 (six) hours as needed.   Yes [provider]  doxycycline  (VIBRAMYCIN ) 100 MG capsule Take 100  mg by mouth 2 (two) times daily. 06/08/20  Yes [provider]  ferrous sulfate 325 (65 FE) MG EC tablet Place 325 mg under the tongue 2 (two) times daily with a meal.   Yes [provider]  fexofenadine (ALLEGRA) 180 MG tablet Take 180 mg by mouth daily.   Yes [provider]  fluticasone (FLONASE) 50 MCG/ACT nasal spray Place into both nostrils daily.   Yes [provider]  Multiple Vitamins-Iron (MULTIVITAMIN/IRON PO) Take 1 tablet by mouth daily.   Yes [provider]  nitrofurantoin, macrocrystal-monohydrate, (MACROBID) 100 MG capsule Take 100 mg by mouth every 12 (twelve) hours. 10/06/23  Yes [provider]  omeprazole  (PRILOSEC) 20 MG capsule Take 20 mg by mouth at bedtime.   Yes [provider]  acetaminophen  (TYLENOL ) 500 MG tablet Take 1,000 mg by mouth every 6 (six) hours as needed for moderate pain.    [provider]  ALPRAZolam  (XANAX ) 0.5 MG tablet Take 0.25 mg by mouth in the morning. 04/13/20   [provider]  EUTHYROX 100 MCG tablet Take 100 mcg by mouth daily with lunch. 11/21/19   [provider]  guaiFENesin (MUCINEX) 600 MG 12 hr tablet Take by mouth 2 (two) times daily as needed.    [provider]  naloxone [facility]) nasal spray 4 mg/0.1 mL Place 2 sprays into the nose once. 12/29/22   [provider]  traMADol  (ULTRAM ) 50 MG tablet Take 1 tablet (50 mg total) by mouth every 6 (six) hours as needed for moderate pain. 05/19/20   Cindy Garnette POUR, MD  triamcinolone (NASACORT) 55 MCG/ACT AERO nasal inhaler Place 2 sprays into the nose 2 (two) times daily.    [provider]    Family History Family History  Problem Relation Age of Onset   Hypertension Other    Diabetes Other    Lupus Other    Thyroid  disease Mother    Heart failure Father     Social History Social History   Tobacco Use   Smoking status: Former    Types: Cigarettes   Smokeless tobacco:  Never  Vaping Use   Vaping status: Never Used  Substance Use Topics   Alcohol use: Not Currently   Drug use: No     Allergies   Clindamycin, Clindamycin/lincomycin, and Zoloft [sertraline hcl]   Review of Systems Review of Systems  Constitutional:  Negative for chills and fever.  Eyes:  Negative for discharge and redness.  Respiratory:  Negative for shortness of breath.   Gastrointestinal:  Negative for abdominal pain, nausea and vomiting.  Musculoskeletal:  Positive for arthralgias.  Neurological:  Negative for numbness.     Physical Exam Triage Vital Signs ED Triage Vitals  Encounter Vitals Group     BP 10/25/23 1416 114/72     Girls Systolic BP Percentile --      Girls Diastolic BP Percentile --      Boys Systolic BP Percentile --      Boys Diastolic BP Percentile --      Pulse Rate 10/25/23 1416 94     Resp 10/25/23 1416 20     Temp 10/25/23 1416 99.5 F (37.5 C)     Temp Source 10/25/23 1416 Oral     SpO2 10/25/23 1416 96 %     Weight 10/25/23 1411 289 lb (131.1 kg)     Height 10/25/23 1411 4' 9 (1.448 m)     Head Circumference --      Peak Flow --      Pain Score 10/25/23 1403 10     Pain Loc --      Pain Education --      Exclude from Growth Chart --    No data found.  Updated Vital Signs BP 114/72 (BP Location: Left Arm)   Pulse 94   Temp 99.5 F (37.5 C) (Oral)   Resp 20   Ht 4' 9 (1.448 m)   Wt 289 lb (131.1 kg)   LMP 12/23/2022 (Exact Date)   SpO2 96%   BMI 62.54 kg/m   Visual Acuity Right Eye Distance:   Left Eye Distance:   Bilateral Distance:    Right Eye Near:   Left Eye Near:    Bilateral Near:     Physical Exam Vitals and nursing note reviewed.  Constitutional:      General: She is not in acute distress.    Appearance: Normal appearance. She is not ill-appearing.  HENT:     Head: Normocephalic and atraumatic.  Eyes:     Conjunctiva/sclera: Conjunctivae normal.  Cardiovascular:     Rate and Rhythm: Normal rate.   Pulmonary:     Effort: Pulmonary effort is normal. No respiratory distress.  Musculoskeletal:     Comments: Mild diffuse swelling to left foot.  No erythema present  Neurological:     Mental Status: She is alert.  Psychiatric:        Mood and Affect: Mood normal.        Behavior: Behavior normal.        Thought Content: Thought content normal.      UC Treatments / Results  Labs (all labs ordered are listed, but only abnormal results are displayed) Labs Reviewed - No data to display  EKG   Radiology No results found.  Procedures Procedures (including critical care time)  Medications Ordered in UC Medications - No data to display  Initial Impression / Assessment and Plan / UC Course  I have reviewed the triage vital signs and the nursing notes.  Pertinent labs & imaging results that were available during my care of the patient were reviewed by me and considered in my medical decision making (see chart for details).    Closed nondisplaced fracture noted on x-ray.  Seemingly subacute, recommended further evaluation by Ortho.  Patient has postop shoe and crutches provided in office.  Final Clinical Impressions(s) / UC Diagnoses   Final diagnoses:  Closed nondisplaced fracture of second metatarsal bone  of left foot, sequela     Discharge Instructions       Please follow up with ortho as soon as possible.  Wear boot or post op shoe anytime you are weight bearing in the meantime.      ED Prescriptions   None    PDMP not reviewed this encounter.   Billy Asberry FALCON, PA-C 10/28/23 [phone]

## 2023-12-09 ENCOUNTER — Ambulatory Visit (HOSPITAL_COMMUNITY)
Admission: RE | Admit: 2023-12-09 | Discharge: 2023-12-09 | Disposition: A | Discharge status: Home / Self Care | Source: Ambulatory Visit | Attending: Vascular Surgery | Admitting: Vascular Surgery

## 2023-12-09 ENCOUNTER — Other Ambulatory Visit (HOSPITAL_COMMUNITY): Payer: Self-pay | Admitting: Internal Medicine

## 2023-12-09 DIAGNOSIS — R600 Localized edema: Secondary | ICD-10-CM | POA: Insufficient documentation

## 2024-03-21 ENCOUNTER — Ambulatory Visit: Payer: Self-pay

## 2024-03-21 NOTE — Telephone Encounter (Signed)
 FYI Only or Action Required?: FYI only for provider: Advised UC for htn and refill on amlodipine.  Patient was last seen in primary care on Not established with [facility] PCP.  Called Nurse Triage reporting Hypertension, Medication Refill, and Sinusitis.  Symptoms began several days ago.  Interventions attempted: OTC medications: Sudafed with tylenol .  Symptoms are: gradually worsening.  Triage Disposition: See Physician Within 24 Hours (overriding Call PCP Within 24 Hours)  Patient/caregiver understands and will follow disposition?: Yes    Onset of increased BP x3 days. 173/88, HR 102 currently. Normally 110/86. Has hx of htn with series of mini strokes. Takes amlodipine currently, only has 1 tablet left. Has not missed any doses, took today. Needing it refilled but now on Medicaid and medication wont be covered if ordered by current non-[facility] PCP. Mild dizziness x4 days with sinus congestion. Denies CP or SOB. No numbness, tingling, weakness, increased difficulty walking or changes to speech or vision. Speaking in clear full coherent continuous sentences. New pt appt scheduled 1/15, no sooner appts. Advised UC today to get medication refilled and to assess htn, reports she has a $4 co-pay and this would work for her. Advised ED for worsening symptoms.     Copied from CRM [id number]. Topic: Clinical - Red Word Triage >> Mar 21, 2024 11:31 AM Donna BRAVO wrote: Red Word that prompted transfer to Nurse Triage:  new Patient appt on 04/07/24.  Medicaid effective on 01/23/24  assigned [facility]. Medicaid will not pay of refill when previous provider is no longer in network. the prescription needs to have the signature of new provider on it.       Pt has 1 pill left of BP medication amLODipine (NORVASC) 10 MG tablet.   BP  173/88  -severe headache my also be due to head cold  -dizzy -Patient has a head cold Reason for Disposition  Ran out of BP medications    Reports [facility] PCP no longer in network since switching to Medicaid. Needing refill of amlodipine from provider in network. New pt appt scheduled 1/15, no sooner appts.  Answer Assessment - Initial Assessment Questions 1. BLOOD PRESSURE: What is your blood pressure? Did you take at least two measurements 5 minutes apart?     173/88, HR 102, 140/85, HR 96  2. ONSET: When did you take your blood pressure?     Now  3. HOW: How did you take your blood pressure? (e.g., automatic home BP monitor, visiting nurse)     Wrist cuff  4. HISTORY: Do you have a history of high blood pressure?     Yes  5. MEDICINES: Are you taking any medicines for blood pressure? Have you missed any doses recently?     Amlodipine  6. OTHER SYMPTOMS: Do you have any symptoms? (e.g., blurred vision, chest pain, difficulty breathing, headache, weakness)     Sinus congestion, mild dizziness, post nasal drip, headache  Protocols used: Blood Pressure - High-A-AH

## 2024-04-07 ENCOUNTER — Encounter: Payer: Self-pay | Admitting: Family Medicine

## 2024-04-07 ENCOUNTER — Ambulatory Visit (INDEPENDENT_AMBULATORY_CARE_PROVIDER_SITE_OTHER): Admitting: Family Medicine

## 2024-04-07 VITALS — BP 130/76 | HR 90 | Resp 16 | Ht <= 58 in | Wt 275.0 lb

## 2024-04-07 DIAGNOSIS — Z113 Encounter for screening for infections with a predominantly sexual mode of transmission: Secondary | ICD-10-CM

## 2024-04-07 DIAGNOSIS — Z0000 Encounter for general adult medical examination without abnormal findings: Secondary | ICD-10-CM

## 2024-04-07 DIAGNOSIS — E538 Deficiency of other specified B group vitamins: Secondary | ICD-10-CM | POA: Diagnosis not present

## 2024-04-07 DIAGNOSIS — E039 Hypothyroidism, unspecified: Secondary | ICD-10-CM | POA: Diagnosis not present

## 2024-04-07 DIAGNOSIS — G4733 Obstructive sleep apnea (adult) (pediatric): Secondary | ICD-10-CM | POA: Diagnosis not present

## 2024-04-07 DIAGNOSIS — E611 Iron deficiency: Secondary | ICD-10-CM | POA: Diagnosis not present

## 2024-04-07 DIAGNOSIS — I10 Essential (primary) hypertension: Secondary | ICD-10-CM | POA: Diagnosis not present

## 2024-04-07 DIAGNOSIS — Z1231 Encounter for screening mammogram for malignant neoplasm of breast: Secondary | ICD-10-CM | POA: Diagnosis not present

## 2024-04-07 DIAGNOSIS — E559 Vitamin D deficiency, unspecified: Secondary | ICD-10-CM | POA: Diagnosis not present

## 2024-04-07 DIAGNOSIS — Z1211 Encounter for screening for malignant neoplasm of colon: Secondary | ICD-10-CM

## 2024-04-07 DIAGNOSIS — R7303 Prediabetes: Secondary | ICD-10-CM | POA: Diagnosis not present

## 2024-04-07 DIAGNOSIS — K219 Gastro-esophageal reflux disease without esophagitis: Secondary | ICD-10-CM

## 2024-04-07 LAB — MICROALBUMIN / CREATININE URINE RATIO
Creatinine, Urine: 94 mg/dL (ref 20–275)
Microalb Creat Ratio: 4 mg/g{creat}
Microalb, Ur: 0.4 mg/dL

## 2024-04-07 MED ORDER — AMLODIPINE BESYLATE 10 MG PO TABS: 10.0000 mg | ORAL_TABLET | Freq: Every day | ORAL | 0 refills | Status: AC

## 2024-04-07 NOTE — Assessment & Plan Note (Addendum)
 HTN controlled ; BP is 130/76 at today's visit    -Continue Amlodipine  10mg  once daily, refill provided Orders:   Comprehensive Metabolic Panel (CMET)   amLODipine  (NORVASC ) 10 MG tablet; Take 1 tablet (10 mg total) by mouth daily.

## 2024-04-07 NOTE — Progress Notes (Signed)
 "  New Patient Office Visit  Subjective    Patient ID: Tonya Cameron, female    DOB: 02/18/1971  Age: 54 y.o. MRN: [id number]  CC:  Chief Complaint  Patient presents with   Establish Care    HPI Tonya Cameron presents to establish care. She is a pleasant 54 year old female seen in office to establish care, transferring from [facility]. She does request that Amlodipine  is refilled today. She voices that her last PCP would obtain an annual ANA due to family hx of Lupus. A request has been placed to obtain medical records from her last PCP.   Other specialists she is established with includes nephrology, [facility] in [facility], and psychiatry, [facility] in [place]. Will request records.  Psychiatry managing the following medications: Xanax , Adderall, Abilify , Celexa , Pristiq.  She reports she was last seen by nephrology one year ago, and follows with psychiatry every 90 days. She does confirm known CKD.  Labs are being obtained today.   She had previously seen cardiology  but this was several years ago to confirm OSA diagnosis. She reports sleep study ordered by cardiology, estimating 30 years ago, and she was diagnosed with OSA. OSA managed with CPAP. She reports compliance with CPAP, and does see a benefit in symptoms with CPAP use.    Will note on medication list that both Allegra and Zyrtec are listed, which she states she alternates these medications, never taking both on the same day.  Other OTC supplements include B12, vitamin D , and iron. She voices she was told that she needed to take all of these supplements. Assume prior diagnosed B12 deficiency, vitamin D  deficiency and iron deficiency. Updating labs, see below.   Outpatient Encounter Medications as of 04/07/2024  Medication Sig   acetaminophen  (TYLENOL ) 500 MG tablet Take 1,000 mg by mouth every 6 (six) hours as needed for moderate pain.   ALPRAZolam  (XANAX ) 0.5 MG tablet Take 0.25  mg by mouth in the morning.   amphetamine -dextroamphetamine  (ADDERALL) 30 MG tablet Take 30 mg by mouth 2 (two) times daily.   ARIPiprazole  (ABILIFY ) 5 MG tablet Take 5 mg by mouth at bedtime.   cetirizine (ZYRTEC) 10 MG tablet Take 10 mg by mouth daily.   cholecalciferol (VITAMIN D3) 25 MCG (1000 UNIT) tablet Take 1,000 Units by mouth daily.   citalopram  (CELEXA ) 40 MG tablet Take 40 mg by mouth at bedtime. (Patient taking differently: Take 60 mg by mouth at bedtime.)   desvenlafaxine (PRISTIQ) 50 MG 24 hr tablet Take 50 mg by mouth every morning.   diphenhydrAMINE (BENADRYL) 25 mg capsule Take 25 mg by mouth every 6 (six) hours as needed.   ferrous sulfate 325 (65 FE) MG EC tablet Place 325 mg under the tongue 2 (two) times daily with a meal.   fexofenadine (ALLEGRA) 180 MG tablet Take 180 mg by mouth daily.   levothyroxine (SYNTHROID) 100 MCG tablet Take 100 mcg by mouth daily before breakfast.   magnesium 30 MG tablet Take 1 tablet by mouth daily.   Multiple Vitamins-Iron (MULTIVITAMIN/IRON PO) Take 1 tablet by mouth daily.   naloxone (NARCAN) nasal spray 4 mg/0.1 mL Place 2 sprays into the nose once.   omeprazole  (PRILOSEC) 20 MG capsule Take 20 mg by mouth at bedtime.   vitamin B-12 (CYANOCOBALAMIN) 100 MCG tablet Take 100 mcg by mouth daily.   Zinc Sulfate (ZINC 15 PO) Take 1 tablet by mouth daily.   [DISCONTINUED] amLODipine  (NORVASC )  10 MG tablet Take 10 mg by mouth daily.   [DISCONTINUED] traMADol  (ULTRAM ) 50 MG tablet Take 1 tablet (50 mg total) by mouth every 6 (six) hours as needed for moderate pain.   amLODipine  (NORVASC ) 10 MG tablet Take 1 tablet (10 mg total) by mouth daily.   [DISCONTINUED] azithromycin (ZITHROMAX) 250 MG tablet Take by mouth.   [DISCONTINUED] doxycycline  (VIBRAMYCIN ) 100 MG capsule Take 100 mg by mouth 2 (two) times daily.   [DISCONTINUED] EUTHYROX 100 MCG tablet Take 100 mcg by mouth daily with lunch.   [DISCONTINUED] fluticasone (FLONASE) 50 MCG/ACT nasal  spray Place into both nostrils daily.   [DISCONTINUED] guaiFENesin (MUCINEX) 600 MG 12 hr tablet Take by mouth 2 (two) times daily as needed.   [DISCONTINUED] nitrofurantoin, macrocrystal-monohydrate, (MACROBID) 100 MG capsule Take 100 mg by mouth every 12 (twelve) hours.   [DISCONTINUED] triamcinolone (NASACORT) 55 MCG/ACT AERO nasal inhaler Place 2 sprays into the nose 2 (two) times daily.   No facility-administered encounter medications on file as of 04/07/2024.    Past Medical History:  Diagnosis Date   Anxiety    Calculus of left kidney    Depression    Edema    swelling in legs started on 06/21/20    Frequency of urination    GERD (gastroesophageal reflux disease)    Hematuria    History of diabetes mellitus PT STATES NO PROBLEMS SINCE 2009 GASTRIC BYPASS   History of kidney stones    History of sleep apnea OSA W/ CPAP--  NO ISSUES SINCE 2009 GASTRIC BYPASS   Hypertension    Hypothyroid    Iron deficiency anemia    Pneumonia    hx of as a baby    Pre-diabetes    Recurrent sinus infections    S/P gastric bypass    Seasonal allergies    Sleep apnea    cpap    Thyroid  disease    Urgency of urination    Vitamin D  deficiency     Past Surgical History:  Procedure Laterality Date   CESAREAN SECTION  04-20-1997   CYSTOSCOPY W/ URETERAL STENT PLACEMENT  10/17/2011   Procedure: CYSTOSCOPY WITH RETROGRADE PYELOGRAM/URETERAL STENT PLACEMENT;  Surgeon: Mark C Ottelin, MD;  Location: [facility] ;  Service: Urology;  Laterality: Left;   CYSTOSCOPY W/ URETERAL STENT PLACEMENT Right 05/17/2020   Procedure: CYSTOSCOPY WITH RETROGRADE PYELOGRAM/URETERAL STENT PLACEMENT;  Surgeon: Rosalind Zachary NOVAK, MD;  Location: [facility];  Service: Urology;  Laterality: Right;   CYSTOSCOPY/URETEROSCOPY/HOLMIUM LASER/STENT PLACEMENT Right 07/03/2020   Procedure: CYSTOSCOPY/URETEROSCOPY/HOLMIUM LASER/STENT PLACEMENT;  Surgeon: Rosalind Zachary NOVAK, MD;  Location: [facility];  Service: Urology;   Laterality: Right;  1 HR   LAPAROSCOPIC GASTRIC BYPASS  03-20-2008   MULTIPLE TOOTH EXTRACTIONS     PERCUTANEOUS NEPHROSTOLITHOTOMY  JAN 2012 ([facility])   LEFT SIDE STONE EXTRACTION   RIGHT URETEROSCOPIC STONE EXTRACTION  JAN 2012    Family History  Problem Relation Age of Onset   Hypertension Other    Diabetes Other    Lupus Other    Thyroid  disease Mother    Heart failure Father     Social History   Socioeconomic History   Marital status: Divorced    Spouse name: Not on file   Number of children: Not on file   Years of education: Not on file   Highest education level: Not on file  Occupational History   Not on file  Tobacco Use   Smoking status: Former  Types: Cigarettes   Smokeless tobacco: Never  Vaping Use   Vaping status: Never Used  Substance and Sexual Activity   Alcohol use: Not Currently   Drug use: No   Sexual activity: Not Currently    Birth control/protection: None, Condom  Other Topics Concern   Not on file  Social History Narrative   Not on file   Social Drivers of Health   Tobacco Use: Medium Risk (04/07/2024)   Patient History    Smoking Tobacco Use: Former    Smokeless Tobacco Use: Never    Passive Exposure: Not on file  Financial Resource Strain: Low Risk (04/07/2024)   Overall Financial Resource Strain (CARDIA)    Difficulty of Paying Living Expenses: Not hard at all  Food Insecurity: No Food Insecurity (04/07/2024)   Epic    Worried About Programme Researcher, Broadcasting/film/video in the Last Year: Never true    Ran Out of Food in the Last Year: Never true  Transportation Needs: No Transportation Needs (04/07/2024)   Epic    Lack of Transportation (Medical): No    Lack of Transportation (Non-Medical): No  Physical Activity: Inactive (04/07/2024)   Exercise Vital Sign    Days of Exercise per Week: 0 days    Minutes of Exercise per Session: 0 min  Stress: No Stress Concern Present (04/07/2024)   [employer] of Occupational Health - Occupational Stress  Questionnaire    Feeling of Stress: Not at all  Social Connections: Socially Isolated (04/07/2024)   Social Connection and Isolation Panel    Frequency of Communication with Friends and Family: Once a week    Frequency of Social Gatherings with Friends and Family: Never    Attends Religious Services: Never    Database Administrator or Organizations: No    Attends Banker Meetings: Never    Marital Status: Divorced  Catering Manager Violence: Not At Risk (04/07/2024)   Epic    Fear of Current or Ex-Partner: No    Emotionally Abused: No    Physically Abused: No    Sexually Abused: No  Depression (PHQ2-9): Low Risk (04/07/2024)   Depression (PHQ2-9)    PHQ-2 Score: 0  Alcohol Screen: Low Risk (04/07/2024)   Alcohol Screen    Last Alcohol Screening Score (AUDIT): 0  Housing: Low Risk (04/07/2024)   Epic    Unable to Pay for Housing in the Last Year: No    Number of Times Moved in the Last Year: 0    Homeless in the Last Year: No  Utilities: Not At Risk (04/07/2024)   Epic    Threatened with loss of utilities: No  Health Literacy: Adequate Health Literacy (04/07/2024)   B1300 Health Literacy    Frequency of need for help with medical instructions: Never    Review of Systems  Constitutional:  Positive for malaise/fatigue. Negative for weight loss.  Respiratory:  Negative for shortness of breath.   Cardiovascular:  Negative for chest pain.  Gastrointestinal:  Negative for blood in stool, constipation and diarrhea.        Objective    BP 130/76   Pulse 90   Resp 16   Ht 4' 9 (1.448 m)   Wt 275 lb (124.7 kg)   LMP 12/23/2022   SpO2 99%   BMI 59.51 kg/m   Physical Exam Constitutional:      Appearance: She is obese.  HENT:     Head: Normocephalic and atraumatic.  Eyes:     Conjunctiva/sclera: Conjunctivae  normal.  Cardiovascular:     Rate and Rhythm: Normal rate and regular rhythm.     Heart sounds: Normal heart sounds.  Pulmonary:     Effort: Pulmonary  effort is normal.     Breath sounds: Normal breath sounds.  Skin:    General: Skin is warm and dry.  Neurological:     General: No focal deficit present.     Mental Status: She is alert. Mental status is at baseline.  Psychiatric:        Mood and Affect: Mood normal.        Behavior: Behavior normal.       Assessment & Plan:   Assessment & Plan Essential hypertension HTN controlled ; BP is 130/76 at today's visit    -Continue Amlodipine  10mg  once daily, refill provided Orders:   Comprehensive Metabolic Panel (CMET)   amLODipine  (NORVASC ) 10 MG tablet; Take 1 tablet (10 mg total) by mouth daily.  Gastroesophageal reflux disease without esophagitis GERD managed with OTC Omeprazole  20mg  daily.     Prediabetes Documented diabetes diagnosis, however highest A1c reviewed is 5.9. She confirms diagnosis of prediabetes. Denies hx of type 2 DM.  Diagnosis of prediabetes is when she became motivated making lifestyle changes including dietary changes and weight loss, noting hx of gastric bypass surgery.   -Labs updated, see below Orders:   Comprehensive Metabolic Panel (CMET)   HgB A1c   Urine Microalbumin w/creat. ratio  OSA on CPAP OSA diagnosed 30 years ago, continues CPAP therapy.  -Continue CPAP therapy    Vitamin D  deficiency -Update labs Orders:   Vitamin D  (25 hydroxy)  B12 deficiency -Update labs Orders:   CBC with Differential/Platelet   B12 and Folate Panel  Iron deficiency -Update labs Orders:   CBC with Differential/Platelet   Fe+TIBC+Fer  Hypothyroidism, unspecified type Reports diagnosis of hypothyroidism managed with Levothyroxine 100mcg  -Update labs -Continue Levothyroxine as prescribed, no changes at this time Orders:   TSH  Colon cancer screening Reports hx of abnormal cologuard in 01/2024. Did not have follow up colonoscopy.   -GI referral placed for follow up/ colonoscopy  Orders:   Ambulatory referral to  Gastroenterology  Healthcare maintenance -Hx of abnormal cologuard, last completed 01/2024. Reports she was working on getting scheduled for a colonoscopy but has not been able to. GI referral placed, see above. -Pap not UTD, scheduled for 3 month f/u for annual physical with pap and fasting labs -Mammogram not UTD, mammogram ordered    Breast cancer screening by mammogram Mammogram ordered Orders:   MM 3D SCREENING MAMMOGRAM BILATERAL BREAST; Future  Screening examination for STD (sexually transmitted disease)  Orders:   Hepatitis C Antibody      Return in about 3 months (around 07/06/2024) for annual physical with pap and fasting labs .   LAYMON LOISE CORE, FNP   "

## 2024-04-07 NOTE — Assessment & Plan Note (Addendum)
 GERD managed with OTC Omeprazole  20mg  daily.

## 2024-04-08 ENCOUNTER — Ambulatory Visit: Payer: Self-pay | Admitting: Family Medicine

## 2024-04-08 LAB — HEMOGLOBIN A1C
Hgb A1c MFr Bld: 5.7 % — ABNORMAL HIGH
Mean Plasma Glucose: 117 mg/dL
eAG (mmol/L): 6.5 mmol/L

## 2024-04-08 LAB — COMPREHENSIVE METABOLIC PANEL WITH GFR
AG Ratio: 1.2 (calc) (ref 1.0–2.5)
ALT: 20 U/L (ref 6–29)
AST: 18 U/L (ref 10–35)
Albumin: 3.8 g/dL (ref 3.6–5.1)
Alkaline phosphatase (APISO): 107 U/L (ref 37–153)
BUN/Creatinine Ratio: 11 (calc) (ref 6–22)
BUN: 14 mg/dL (ref 7–25)
CO2: 29 mmol/L (ref 20–32)
Calcium: 9 mg/dL (ref 8.6–10.4)
Chloride: 105 mmol/L (ref 98–110)
Creat: 1.27 mg/dL — ABNORMAL HIGH (ref 0.50–1.03)
Globulin: 3.1 g/dL (ref 1.9–3.7)
Glucose, Bld: 98 mg/dL (ref 65–99)
Potassium: 4.6 mmol/L (ref 3.5–5.3)
Sodium: 142 mmol/L (ref 135–146)
Total Bilirubin: 0.2 mg/dL (ref 0.2–1.2)
Total Protein: 6.9 g/dL (ref 6.1–8.1)
eGFR: 51 mL/min/1.73m2 — ABNORMAL LOW

## 2024-04-08 LAB — CBC WITH DIFFERENTIAL/PLATELET
Absolute Lymphocytes: 1554 {cells}/uL (ref 850–3900)
Absolute Monocytes: 680 {cells}/uL (ref 200–950)
Basophils Absolute: 59 {cells}/uL (ref 0–200)
Basophils Relative: 0.7 %
Eosinophils Absolute: 176 {cells}/uL (ref 15–500)
Eosinophils Relative: 2.1 %
HCT: 38.6 % (ref 35.9–46.0)
Hemoglobin: 12.4 g/dL (ref 11.7–15.5)
MCH: 29.5 pg (ref 27.0–33.0)
MCHC: 32.1 g/dL (ref 31.6–35.4)
MCV: 91.9 fL (ref 81.4–101.7)
MPV: 11.8 fL (ref 7.5–12.5)
Monocytes Relative: 8.1 %
Neutro Abs: 5930 {cells}/uL (ref 1500–7800)
Neutrophils Relative %: 70.6 %
Platelets: 327 Thousand/uL (ref 140–400)
RBC: 4.2 Million/uL (ref 3.80–5.10)
RDW: 13.5 % (ref 11.0–15.0)
Total Lymphocyte: 18.5 %
WBC: 8.4 Thousand/uL (ref 3.8–10.8)

## 2024-04-08 LAB — IRON,TIBC AND FERRITIN PANEL
%SAT: 15 % — ABNORMAL LOW (ref 16–45)
Ferritin: 30 ng/mL (ref 16–232)
Iron: 52 ug/dL (ref 45–160)
TIBC: 346 ug/dL (ref 250–450)

## 2024-04-08 LAB — VITAMIN D 25 HYDROXY (VIT D DEFICIENCY, FRACTURES): Vit D, 25-Hydroxy: 34 ng/mL (ref 30–100)

## 2024-04-08 LAB — TSH: TSH: 1.4 m[IU]/L

## 2024-04-08 LAB — HEPATITIS C ANTIBODY: Hepatitis C Ab: NONREACTIVE

## 2024-04-08 LAB — B12 AND FOLATE PANEL
Folate: >24 ng/mL
Vitamin B-12: 1288 pg/mL — ABNORMAL HIGH (ref 200–1100)

## 2024-04-14 ENCOUNTER — Telehealth: Payer: Self-pay

## 2024-04-14 ENCOUNTER — Other Ambulatory Visit: Payer: Self-pay

## 2024-04-14 DIAGNOSIS — Z1211 Encounter for screening for malignant neoplasm of colon: Secondary | ICD-10-CM

## 2024-04-14 MED ORDER — NA SULFATE-K SULFATE-MG SULF 17.5-3.13-1.6 GM/177ML PO SOLN: 1.0000 | Freq: Once | ORAL | 0 refills | Status: AC

## 2024-04-14 NOTE — Telephone Encounter (Signed)
 Gastroenterology Pre-Procedure Review  Request Date: 04/29/24 Requesting Physician: Dr. theophilus  PATIENT REVIEW QUESTIONS: The patient responded to the following health history questions as indicated:    1. Are you having any GI issues? no 2. Do you have a personal history of Polyps? no 3. Do you have a family history of Colon Cancer or Polyps? no 4. Diabetes Mellitus? no 5. Joint replacements in the past 12 months?no 6. Major health problems in the past 3 months?no 7. Any artificial heart valves, MVP, or defibrillator?no    MEDICATIONS & ALLERGIES:    Patient reports the following regarding taking any anticoagulation/antiplatelet therapy:   Plavix, Coumadin, Eliquis, Xarelto, Lovenox, Pradaxa, Brilinta, or Effient? no Aspirin? no  Patient confirms/reports the following medications:  Current Outpatient Medications  Medication Sig Dispense Refill   acetaminophen  (TYLENOL ) 500 MG tablet Take 1,000 mg by mouth every 6 (six) hours as needed for moderate pain.     ALPRAZolam  (XANAX ) 0.5 MG tablet Take 0.25 mg by mouth in the morning.     amphetamine -dextroamphetamine  (ADDERALL) 30 MG tablet Take 30 mg by mouth 2 (two) times daily.     ARIPiprazole  (ABILIFY ) 5 MG tablet Take 5 mg by mouth at bedtime.     cetirizine (ZYRTEC) 10 MG tablet Take 10 mg by mouth daily.     citalopram  (CELEXA ) 40 MG tablet Take 40 mg by mouth at bedtime. (Patient taking differently: Take 60 mg by mouth at bedtime.)     desvenlafaxine (PRISTIQ) 50 MG 24 hr tablet Take 50 mg by mouth every morning.     diphenhydrAMINE (BENADRYL) 25 mg capsule Take 25 mg by mouth every 6 (six) hours as needed.     ferrous sulfate 325 (65 FE) MG EC tablet Place 325 mg under the tongue 2 (two) times daily with a meal.     fexofenadine (ALLEGRA) 180 MG tablet Take 180 mg by mouth daily.     Multiple Vitamins-Iron (MULTIVITAMIN/IRON PO) Take 1 tablet by mouth daily.     Na Sulfate-K Sulfate-Mg Sulfate concentrate (SUPREP)  17.5-3.13-1.6 GM/177ML SOLN Take 1 kit (354 mLs total) by mouth once for 1 dose. 354 mL 0   naloxone (NARCAN) nasal spray 4 mg/0.1 mL Place 2 sprays into the nose once.     omeprazole  (PRILOSEC) 20 MG capsule Take 20 mg by mouth at bedtime.     amLODipine  (NORVASC ) 10 MG tablet Take 1 tablet (10 mg total) by mouth daily. 90 tablet 0   cholecalciferol (VITAMIN D3) 25 MCG (1000 UNIT) tablet Take 1,000 Units by mouth daily.     levothyroxine (SYNTHROID) 100 MCG tablet Take 100 mcg by mouth daily before breakfast.     magnesium 30 MG tablet Take 1 tablet by mouth daily.     vitamin B-12 (CYANOCOBALAMIN) 100 MCG tablet Take 100 mcg by mouth daily.     Zinc Sulfate (ZINC 15 PO) Take 1 tablet by mouth daily.     No current facility-administered medications for this visit.    Patient confirms/reports the following allergies:  Allergies[1]  No orders of the defined types were placed in this encounter.   AUTHORIZATION INFORMATION Primary Insurance: 1D#: Group #:  Secondary Insurance: 1D#: Group #:  SCHEDULE INFORMATION: Date: 04/29/24 Time: Location: [facility]    [1]  Allergies Allergen Reactions   Clindamycin Dermatitis    Oral rash(chicken pox in mouth) & thrush   Clindamycin/Lincomycin Rash    Oral rash(chicken pox in mouth) & thrush   Zoloft [Sertraline Hcl] Swelling and  Rash    Full body rash, throat/eyes/tongue swelling

## 2024-04-21 ENCOUNTER — Telehealth: Payer: Self-pay

## 2024-04-21 NOTE — Telephone Encounter (Unsigned)
 Copied from CRM #[id number]. Topic: Referral - Status >> Apr 21, 2024  1:59 PM Terri G wrote: Reason for CRM: Patient stated she was referred to get an colonscopy 2weeks ago but nobody has reached out to her to scheduled her for an appt. Callback number [phone]

## 2024-04-21 NOTE — Telephone Encounter (Signed)
 Contacted Tonya Cameron to remind her that her procedure was already scheduled for 04/28/24 at [facility].  She said she had not received instructions.  I informed her that I will email them to her because she doesn't have access to her mychart right now.  She was also concerned about procedure time because her son has to get back to work.  I contacted Trish in Endo to request on patients behalf to be scheduled between 8am-11am to allow her son to get back to work.  Thanks,  [place], CMA

## 2024-04-21 NOTE — Telephone Encounter (Signed)
 Per pt she states she must know her time of her procedure before the day before . I explained that we wouldn't have a time until the day before .Per pt she wants to know if there's a way we can make an exception due to her son is unable to get off on short notice.

## 2024-04-29 ENCOUNTER — Ambulatory Visit: Admission: RE | Admit: 2024-04-29 | Discharge: 2024-04-29 | Disposition: A | Source: Home / Self Care

## 2024-04-29 ENCOUNTER — Ambulatory Visit

## 2024-04-29 ENCOUNTER — Other Ambulatory Visit: Payer: Self-pay

## 2024-04-29 ENCOUNTER — Encounter: Admission: RE | Disposition: A | Payer: Self-pay | Source: Home / Self Care

## 2024-04-29 DIAGNOSIS — Z1211 Encounter for screening for malignant neoplasm of colon: Secondary | ICD-10-CM

## 2024-04-29 DIAGNOSIS — D123 Benign neoplasm of transverse colon: Secondary | ICD-10-CM

## 2024-04-29 HISTORY — DX: Cerebral infarction, unspecified: I63.9

## 2024-04-29 MED ORDER — MIDAZOLAM HCL (PF) 2 MG/2ML IJ SOLN
INTRAMUSCULAR | Status: DC | PRN
  Administered 2024-04-29: 2 mg via INTRAVENOUS

## 2024-04-29 MED ORDER — PHENYLEPHRINE 80 MCG/ML (10ML) SYRINGE FOR IV PUSH (FOR BLOOD PRESSURE SUPPORT)
PREFILLED_SYRINGE | INTRAVENOUS | Status: AC
  Filled 2024-04-29: qty 10

## 2024-04-29 MED ORDER — PROPOFOL 1000 MG/100ML IV EMUL
INTRAVENOUS | Status: AC
  Filled 2024-04-29: qty 100

## 2024-04-29 MED ORDER — LIDOCAINE HCL (PF) 2 % IJ SOLN
INTRAMUSCULAR | Status: DC | PRN
  Administered 2024-04-29: 100 mg via INTRADERMAL

## 2024-04-29 MED ORDER — LIDOCAINE HCL (PF) 2 % IJ SOLN
INTRAMUSCULAR | Status: AC
  Filled 2024-04-29: qty 5

## 2024-04-29 MED ORDER — SODIUM CHLORIDE 0.9 % IV SOLN: INTRAVENOUS | Status: DC

## 2024-04-29 MED ORDER — DEXMEDETOMIDINE HCL IN NACL 80 MCG/20ML IV SOLN
INTRAVENOUS | Status: DC | PRN
  Administered 2024-04-29: 12 ug via INTRAVENOUS

## 2024-04-29 MED ORDER — PROPOFOL 500 MG/50ML IV EMUL
INTRAVENOUS | Status: DC | PRN
  Administered 2024-04-29: 150 ug/kg/min via INTRAVENOUS
  Administered 2024-04-29: 50 mg via INTRAVENOUS

## 2024-04-29 MED ORDER — MIDAZOLAM HCL 2 MG/2ML IJ SOLN
INTRAMUSCULAR | Status: AC
  Filled 2024-04-29: qty 2

## 2024-04-29 NOTE — Anesthesia Postprocedure Evaluation (Signed)
"   Anesthesia Post Note  Patient: Tonya Cameron  Procedure(s) Performed: COLONOSCOPY COLONOSCOPY, WITH POLYPECTOMY  Patient location during evaluation: PACU Anesthesia Type: General Level of consciousness: awake Pain management: satisfactory to patient Vital Signs Assessment: post-procedure vital signs reviewed and stable Respiratory status: spontaneous breathing Cardiovascular status: blood pressure returned to baseline Anesthetic complications: no   There were no known notable events for this encounter.   Last Vitals:  Vitals:   04/29/24 1008 04/29/24 1018  BP: (!) 112/51 127/64  Pulse: 79 76  Resp: 20 (!) 22  Temp:    SpO2: 100% 100%    Last Pain:  Vitals:   04/29/24 1018  TempSrc:   PainSc: 0-No pain                 VAN STAVEREN,Jakiyah Stepney      "

## 2024-04-29 NOTE — OR Nursing (Signed)
 Notified MD and Anest of pt stating to have eaten 04/28/24 @1500  1/3 chicken sub sandwich with lettuce; last movement prior to procedure was watery clear with tan swiggley particles.

## 2024-04-29 NOTE — H&P (Signed)
 "  Aloysius JAYSON Nap, MD [facility] [address] [place], [place] [address] Phone: [phone] Fax : [phone]  Primary Care Physician:  Edith Laymon SAILOR, FNP Primary Gastroenterologist:  Dr. Nap  Pre-Procedure History & Physical: HPI:  Tonya Cameron is a 54 y.o. female is here for a screening colonoscopy.   Past Medical History:  Diagnosis Date   Anxiety    Calculus of left kidney    Depression    Edema    swelling in legs started on 06/21/20    Frequency of urination    GERD (gastroesophageal reflux disease)    Hematuria    History of diabetes mellitus PT STATES NO PROBLEMS SINCE 2009 GASTRIC BYPASS   History of kidney stones    History of sleep apnea OSA W/ CPAP--  NO ISSUES SINCE 2009 GASTRIC BYPASS   Hypertension    Hypothyroid    Iron deficiency anemia    Pneumonia    hx of as a baby    Pre-diabetes    Recurrent sinus infections    S/P gastric bypass    Seasonal allergies    Sleep apnea    cpap    Thyroid  disease    Urgency of urination    Vitamin D  deficiency     Past Surgical History:  Procedure Laterality Date   CESAREAN SECTION  04-20-1997   CYSTOSCOPY W/ URETERAL STENT PLACEMENT  10/17/2011   Procedure: CYSTOSCOPY WITH RETROGRADE PYELOGRAM/URETERAL STENT PLACEMENT;  Surgeon: Mark C Ottelin, MD;  Location: [facility];  Service: Urology;  Laterality: Left;   CYSTOSCOPY W/ URETERAL STENT PLACEMENT Right 05/17/2020   Procedure: CYSTOSCOPY WITH RETROGRADE PYELOGRAM/URETERAL STENT PLACEMENT;  Surgeon: Rosalind Zachary NOVAK, MD;  Location: [facility];  Service: Urology;  Laterality: Right;   CYSTOSCOPY/URETEROSCOPY/HOLMIUM LASER/STENT PLACEMENT Right 07/03/2020   Procedure: CYSTOSCOPY/URETEROSCOPY/HOLMIUM LASER/STENT PLACEMENT;  Surgeon: Rosalind Zachary NOVAK, MD;  Location: [facility];  Service: Urology;  Laterality: Right;  1 HR   LAPAROSCOPIC GASTRIC BYPASS  03-20-2008   MULTIPLE TOOTH EXTRACTIONS     PERCUTANEOUS NEPHROSTOLITHOTOMY  JAN 2012 ([facility])    LEFT SIDE STONE EXTRACTION   RIGHT URETEROSCOPIC STONE EXTRACTION  JAN 2012    Prior to Admission medications  Medication Sig Start Date End Date Taking? Authorizing Provider  acetaminophen  (TYLENOL ) 500 MG tablet Take 1,000 mg by mouth every 6 (six) hours as needed for moderate pain.    [provider]  ALPRAZolam  (XANAX ) 0.5 MG tablet Take 0.25 mg by mouth in the morning. 04/13/20   [provider]  amLODipine  (NORVASC ) 10 MG tablet Take 1 tablet (10 mg total) by mouth daily. 04/07/24   Wall, Laymon SAILOR, FNP  amphetamine -dextroamphetamine  (ADDERALL) 30 MG tablet Take 30 mg by mouth 2 (two) times daily. 04/18/20   [provider]  ARIPiprazole  (ABILIFY ) 5 MG tablet Take 5 mg by mouth at bedtime. 04/12/20   [provider]  cetirizine (ZYRTEC) 10 MG tablet Take 10 mg by mouth daily.    [provider]  cholecalciferol (VITAMIN D3) 25 MCG (1000 UNIT) tablet Take 1,000 Units by mouth daily.    [provider]  citalopram  (CELEXA ) 40 MG tablet Take 40 mg by mouth at bedtime. Patient taking differently: Take 60 mg by mouth at bedtime.    [provider]  desvenlafaxine (PRISTIQ) 50 MG 24 hr tablet Take 50 mg by mouth every morning. 10/08/23   [provider]  diphenhydrAMINE (BENADRYL) 25 mg capsule Take 25 mg by mouth every  6 (six) hours as needed.    [provider]  ferrous sulfate 325 (65 FE) MG EC tablet Place 325 mg under the tongue 2 (two) times daily with a meal.    [provider]  fexofenadine (ALLEGRA) 180 MG tablet Take 180 mg by mouth daily.    [provider]  levothyroxine (SYNTHROID) 100 MCG tablet Take 100 mcg by mouth daily before breakfast.    [provider]  magnesium 30 MG tablet Take 1 tablet by mouth daily.    [provider]  Multiple Vitamins-Iron (MULTIVITAMIN/IRON PO) Take 1 tablet by mouth daily.    [provider]  naloxone [facility]) nasal spray 4  mg/0.1 mL Place 2 sprays into the nose once. 12/29/22   [provider]  omeprazole  (PRILOSEC) 20 MG capsule Take 20 mg by mouth at bedtime.    [provider]  vitamin B-12 (CYANOCOBALAMIN) 100 MCG tablet Take 100 mcg by mouth daily.    [provider]  Zinc Sulfate (ZINC 15 PO) Take 1 tablet by mouth daily.    [provider]    Allergies as of 04/14/2024 - Review Complete 04/14/2024  Allergen Reaction Noted   Clindamycin Dermatitis 06/30/2016   Clindamycin/lincomycin Rash 06/30/2016   Zoloft [sertraline hcl] Swelling and Rash 06/30/2016    Family History  Problem Relation Age of Onset   Hypertension Other    Diabetes Other    Lupus Other    Thyroid  disease Mother    Heart failure Father     Social History   Socioeconomic History   Marital status: Divorced    Spouse name: Not on file   Number of children: Not on file   Years of education: Not on file   Highest education level: Not on file  Occupational History   Not on file  Tobacco Use   Smoking status: Former    Types: Cigarettes   Smokeless tobacco: Never  Vaping Use   Vaping status: Never Used  Substance and Sexual Activity   Alcohol use: Not Currently   Drug use: No   Sexual activity: Not Currently    Birth control/protection: None, Condom  Other Topics Concern   Not on file  Social History Narrative   Not on file   Social Drivers of Health   Tobacco Use: Medium Risk (04/07/2024)   Patient History    Smoking Tobacco Use: Former    Smokeless Tobacco Use: Never    Passive Exposure: Not on Actuary Strain: Low Risk (04/07/2024)   Overall Financial Resource Strain (CARDIA)    Difficulty of Paying Living Expenses: Not hard at all  Food Insecurity: No Food Insecurity (04/07/2024)   Epic    Worried About Programme Researcher, Broadcasting/film/video in the Last Year: Never true    Ran Out of Food in the Last Year: Never true  Transportation Needs: No Transportation Needs (04/07/2024)    Epic    Lack of Transportation (Medical): No    Lack of Transportation (Non-Medical): No  Physical Activity: Inactive (04/07/2024)   Exercise Vital Sign    Days of Exercise per Week: 0 days    Minutes of Exercise per Session: 0 min  Stress: No Stress Concern Present (04/07/2024)   [employer] of Occupational Health - Occupational Stress Questionnaire    Feeling of Stress: Not at all  Social Connections: Socially Isolated (04/07/2024)   Social Connection and Isolation Panel    Frequency of Communication with Friends and Family: Once  a week    Frequency of Social Gatherings with Friends and Family: Never    Attends Religious Services: Never    Database Administrator or Organizations: No    Attends Banker Meetings: Never    Marital Status: Divorced  Catering Manager Violence: Not At Risk (04/07/2024)   Epic    Fear of Current or Ex-Partner: No    Emotionally Abused: No    Physically Abused: No    Sexually Abused: No  Depression (PHQ2-9): Low Risk (04/07/2024)   Depression (PHQ2-9)    PHQ-2 Score: 0  Alcohol Screen: Low Risk (04/07/2024)   Alcohol Screen    Last Alcohol Screening Score (AUDIT): 0  Housing: Low Risk (04/07/2024)   Epic    Unable to Pay for Housing in the Last Year: No    Number of Times Moved in the Last Year: 0    Homeless in the Last Year: No  Utilities: Not At Risk (04/07/2024)   Epic    Threatened with loss of utilities: No  Health Literacy: Adequate Health Literacy (04/07/2024)   B1300 Health Literacy    Frequency of need for help with medical instructions: Never    Review of Systems: See HPI, otherwise negative ROS  Physical Exam: LMP 12/23/2022  General:   Alert,  pleasant and cooperative in NAD Head:  Normocephalic and atraumatic. Neck:  Supple; no masses or thyromegaly. Lungs:  Clear throughout to auscultation.    Heart:  Regular rate and rhythm. Abdomen:  Soft, nontender and nondistended. Normal bowel sounds, without guarding,  and without rebound.   Neurologic:  Alert and  oriented x4;  grossly normal neurologically.  Impression/Plan: Demetrica SHAWNIECE OYOLA is now here to undergo a screening colonoscopy.  Risks, benefits, and alternatives regarding colonoscopy have been reviewed with the patient.  Questions have been answered.  All parties agreeable. "

## 2024-04-29 NOTE — Op Note (Signed)
 [facility] Gastroenterology Patient Name: Tonya Cameron Procedure Date: 04/29/2024 9:33 AM MRN: [id number] Account #: [id number] Date of Birth: [birth year] Admit Type: Outpatient Age: 54 Room: [facility] ENDO ROOM 4 Gender: Female Note Status: Finalized Instrument Name: Colon Scope [id number] Procedure:             Colonoscopy Indications:           Screening for colorectal malignant neoplasm Providers:             Aloysius Eric Nap, MD Laymon Core FNP Medicines:             Monitored Anesthesia Care Complications:         No immediate complications. Procedure:             Pre-Anesthesia Assessment:                        - Prior to the procedure, a History and Physical was                         performed, and patient medications and allergies were                         reviewed. The patient is competent. The risks and                         benefits of the procedure and the sedation options and                         risks were discussed with the patient. All questions                         were answered and informed consent was obtained.                         Patient identification and proposed procedure were                         verified by the physician in the pre-procedure area.                         Mental Status Examination: alert and oriented. CV                         Examination: normal. Prophylactic Antibiotics: The                         patient does not require prophylactic antibiotics.                         Prior Anticoagulants: The patient has taken no                         anticoagulant or antiplatelet agents. ASA Grade                         Assessment: III - A patient with severe systemic                         disease. After reviewing  the risks and benefits, the                         patient was deemed in satisfactory condition to                         undergo the procedure. The anesthesia plan was to use                          general anesthesia. Immediately prior to                         administration of medications, the patient was                         re-assessed for adequacy to receive sedatives. The                         heart rate, respiratory rate, oxygen saturations,                         blood pressure, adequacy of pulmonary ventilation, and                         response to care were monitored throughout the                         procedure. The physical status of the patient was                         re-assessed after the procedure.                        After obtaining informed consent, the colonoscope was                         passed under direct vision. Throughout the procedure,                         the patient's blood pressure, pulse, and oxygen                         saturations were monitored continuously. The                         Colonoscope was introduced through the anus and                         advanced to the the cecum, identified by appendiceal                         orifice and ileocecal valve. The colonoscopy was                         performed without difficulty. The patient tolerated                         the procedure well. The quality of the bowel  preparation was good. The ileocecal valve, appendiceal                         orifice, and rectum were photographed. Findings:      Hemorrhoids were found on perianal exam.      A 3 mm polyp was found in the hepatic flexure. The polyp was sessile.       The polyp was removed with a cold snare. Resection and retrieval were       complete. Verification of patient identification for the specimen was       done. Estimated blood loss was minimal.      Diverticula were found in the sigmoid colon. Impression:            - Hemorrhoids found on perianal exam.                        - One 3 mm polyp at the hepatic flexure, removed with                         a cold snare. Resected and  retrieved.                        - Diverticulosis in the sigmoid colon. Recommendation:        - Discharge patient to home.                        - Resume previous diet. Procedure Code(s):     --- Professional ---                        [phone], Colonoscopy, flexible; with removal of                         tumor(s), polyp(s), or other lesion(s) by snare                         technique Diagnosis Code(s):     --- Professional ---                        Z12.11, Encounter for screening for malignant neoplasm                         of colon                        D12.3, Benign neoplasm of transverse colon (hepatic                         flexure or splenic flexure)                        K64.9, Unspecified hemorrhoids                        K57.30, Diverticulosis of large intestine without                         perforation or abscess without bleeding CPT copyright 2022 American Medical Association. All rights reserved. The codes documented in this report are preliminary and upon coder review may  be revised to meet current compliance requirements. Aloysius Eric Nap, [place] 04/29/2024 10:08:14 AM This report has been signed electronically. Number of Addenda: 0 Note Initiated On: 04/29/2024 9:33 AM Scope Withdrawal Time: 0 hours 7 minutes 17 seconds  Total Procedure Duration: 0 hours 11 minutes 0 seconds  Estimated Blood Loss:  Estimated blood loss: none.      [facility]

## 2024-04-29 NOTE — Transfer of Care (Signed)
 Immediate Anesthesia Transfer of Care Note  Patient: Tonya Cameron  Procedure(s) Performed: COLONOSCOPY COLONOSCOPY, WITH POLYPECTOMY  Patient Location: Endoscopy Unit  Anesthesia Type:General  Level of Consciousness: awake  Airway & Oxygen Therapy: Patient Spontanous Breathing  Post-op Assessment: Report given to RN and Post -op Vital signs reviewed and stable  Post vital signs: Reviewed and stable  Last Vitals:  Vitals Value Taken Time  BP 112/51 04/29/24 10:08  Temp    Pulse 79 04/29/24 10:09  Resp 23 04/29/24 10:09  SpO2 100 % 04/29/24 10:09  Vitals shown include unfiled device data.  Last Pain:  Vitals:   04/29/24 1008  TempSrc:   PainSc: 0-No pain         Complications: There were no known notable events for this encounter.

## 2024-04-29 NOTE — Anesthesia Preprocedure Evaluation (Signed)
 "                                  Anesthesia Evaluation  Patient identified by MRN, date of birth, ID band Patient awake    Reviewed: Allergy & Precautions, NPO status , Patient's Chart, lab work & pertinent test results  Airway Mallampati: III  TM Distance: >3 FB Neck ROM: full    Dental  (+) Upper Dentures, Edentulous Lower   Pulmonary neg pulmonary ROS, sleep apnea and Continuous Positive Airway Pressure Ventilation , former smoker   Pulmonary exam normal  + decreased breath sounds      Cardiovascular Exercise Tolerance: Poor hypertension, Pt. on medications negative cardio ROS Normal cardiovascular exam Rhythm:Regular Rate:Normal     Neuro/Psych   Anxiety     CVA, No Residual Symptoms negative neurological ROS  negative psych ROS   GI/Hepatic negative GI ROS, Neg liver ROS,GERD  Medicated,,  Endo/Other  negative endocrine ROSHypothyroidism  Class 4 obesity  Renal/GU negative Renal ROS  negative genitourinary   Musculoskeletal   Abdominal  (+) + obese  Peds negative pediatric ROS (+)  Hematology negative hematology ROS (+) Blood dyscrasia, anemia   Anesthesia Other Findings Past Medical History: No date: Anxiety No date: Calculus of left kidney No date: Depression No date: Edema     Comment:  swelling in legs started on 06/21/20  No date: Frequency of urination No date: GERD (gastroesophageal reflux disease) No date: Hematuria PT STATES NO PROBLEMS SINCE 2009 GASTRIC BYPASS: History of diabetes  mellitus No date: History of kidney stones OSA W/ CPAP--  NO ISSUES SINCE 2009 GASTRIC BYPASS: History of sleep  apnea No date: Hypertension No date: Hypothyroid No date: Iron deficiency anemia No date: Pneumonia     Comment:  hx of as a baby  No date: Pre-diabetes No date: Recurrent sinus infections No date: S/P gastric bypass No date: Seasonal allergies No date: Sleep apnea     Comment:  cpap  No date: Stroke [facility]) No date: Thyroid   disease No date: Urgency of urination No date: Vitamin D  deficiency  Past Surgical History: 04-20-1997: CESAREAN SECTION 10/17/2011: CYSTOSCOPY W/ URETERAL STENT PLACEMENT     Comment:  Procedure: CYSTOSCOPY WITH RETROGRADE PYELOGRAM/URETERAL              STENT PLACEMENT;  Surgeon: Mark C Ottelin, MD;  Location:              [facility];  Service: Urology;                Laterality: Left; 05/17/2020: CYSTOSCOPY W/ URETERAL STENT PLACEMENT; Right     Comment:  Procedure: CYSTOSCOPY WITH RETROGRADE PYELOGRAM/URETERAL              STENT PLACEMENT;  Surgeon: Rosalind Zachary NOVAK, MD;                Location: [facility];  Service: Urology;  Laterality: Right; 07/03/2020: CYSTOSCOPY/URETEROSCOPY/HOLMIUM LASER/STENT PLACEMENT;  Right     Comment:  Procedure: CYSTOSCOPY/URETEROSCOPY/HOLMIUM LASER/STENT               PLACEMENT;  Surgeon: Rosalind Zachary NOVAK, MD;  Location: [facility];  Service: Urology;  Laterality: Right;  1 HR 03-20-2008: LAPAROSCOPIC GASTRIC BYPASS No date: MULTIPLE TOOTH EXTRACTIONS JAN 2012 ([facility]): PERCUTANEOUS NEPHROSTOLITHOTOMY     Comment:  LEFT SIDE STONE EXTRACTION JAN 2012: RIGHT URETEROSCOPIC STONE EXTRACTION     Reproductive/Obstetrics negative OB ROS                              Anesthesia Physical Anesthesia Plan  ASA: 3  Anesthesia Plan: General   Post-op Pain Management:    Induction: Intravenous  PONV Risk Score and Plan: Propofol  infusion and TIVA  Airway Management Planned: Natural Airway and Nasal Cannula  Additional Equipment:   Intra-op Plan:   Post-operative Plan:   Informed Consent: I have reviewed the patients History and Physical, chart, labs and discussed the procedure including the risks, benefits and alternatives for the proposed anesthesia with the patient or authorized representative who has indicated his/her understanding and acceptance.     Dental Advisory Given  Plan Discussed  with: CRNA  Anesthesia Plan Comments:         Anesthesia Quick Evaluation  "

## 2024-07-06 ENCOUNTER — Ambulatory Visit: Admitting: Family Medicine
